# Patient Record
Sex: Female | Born: 1970 | Race: Black or African American | Hispanic: No | Marital: Married | Smoking: Never smoker
Health system: Southern US, Community
[De-identification: ages and names within clinical notes are randomized; demographics above are authoritative.]

## PROBLEM LIST (undated history)

## (undated) DIAGNOSIS — R51 Headache: Secondary | ICD-10-CM

## (undated) DIAGNOSIS — D649 Anemia, unspecified: Secondary | ICD-10-CM

## (undated) DIAGNOSIS — Z8669 Personal history of other diseases of the nervous system and sense organs: Secondary | ICD-10-CM

## (undated) DIAGNOSIS — I10 Essential (primary) hypertension: Secondary | ICD-10-CM

## (undated) HISTORY — PX: OTHER SURGICAL HISTORY: SHX169

## (undated) HISTORY — DX: Headache: R51

## (undated) HISTORY — DX: Personal history of other diseases of the nervous system and sense organs: Z86.69

## (undated) HISTORY — PX: APPENDECTOMY: SHX54

## (undated) HISTORY — DX: Anemia, unspecified: D64.9

## (undated) HISTORY — DX: Essential (primary) hypertension: I10

---

## 1998-06-16 ENCOUNTER — Observation Stay (HOSPITAL_COMMUNITY): Admission: AD | Admit: 1998-06-16 | Discharge: 1998-06-17 | Payer: Self-pay | Admitting: Obstetrics and Gynecology

## 1998-07-16 ENCOUNTER — Inpatient Hospital Stay (HOSPITAL_COMMUNITY): Admission: AD | Admit: 1998-07-16 | Discharge: 1998-07-16 | Payer: Self-pay | Admitting: Family Medicine

## 1998-08-31 ENCOUNTER — Inpatient Hospital Stay (HOSPITAL_COMMUNITY): Admission: AD | Admit: 1998-08-31 | Discharge: 1998-09-02 | Payer: Self-pay | Admitting: Obstetrics and Gynecology

## 1998-09-27 ENCOUNTER — Encounter (HOSPITAL_COMMUNITY): Admission: RE | Admit: 1998-09-27 | Discharge: 1998-12-26 | Payer: Self-pay

## 1998-10-24 ENCOUNTER — Encounter: Payer: Self-pay | Admitting: Emergency Medicine

## 1998-10-24 ENCOUNTER — Inpatient Hospital Stay (HOSPITAL_COMMUNITY): Admission: EM | Admit: 1998-10-24 | Discharge: 1998-10-25 | Payer: Self-pay | Admitting: Emergency Medicine

## 2001-01-30 ENCOUNTER — Emergency Department (HOSPITAL_COMMUNITY): Admission: EM | Admit: 2001-01-30 | Discharge: 2001-01-30 | Payer: Self-pay | Admitting: Emergency Medicine

## 2001-10-24 ENCOUNTER — Other Ambulatory Visit: Admission: RE | Admit: 2001-10-24 | Discharge: 2001-10-24 | Payer: Self-pay | Admitting: Obstetrics and Gynecology

## 2002-10-26 ENCOUNTER — Other Ambulatory Visit: Admission: RE | Admit: 2002-10-26 | Discharge: 2002-10-26 | Payer: Self-pay | Admitting: Obstetrics and Gynecology

## 2003-03-27 ENCOUNTER — Encounter: Payer: Self-pay | Admitting: Obstetrics and Gynecology

## 2003-03-27 ENCOUNTER — Ambulatory Visit (HOSPITAL_COMMUNITY): Admission: RE | Admit: 2003-03-27 | Discharge: 2003-03-27 | Payer: Self-pay | Admitting: Obstetrics and Gynecology

## 2003-06-22 ENCOUNTER — Encounter: Payer: Self-pay | Admitting: Internal Medicine

## 2003-11-06 ENCOUNTER — Other Ambulatory Visit: Admission: RE | Admit: 2003-11-06 | Discharge: 2003-11-06 | Payer: Self-pay | Admitting: Obstetrics and Gynecology

## 2004-08-08 ENCOUNTER — Ambulatory Visit: Payer: Self-pay | Admitting: Family Medicine

## 2004-09-12 ENCOUNTER — Ambulatory Visit: Payer: Self-pay | Admitting: Internal Medicine

## 2004-11-27 ENCOUNTER — Ambulatory Visit: Payer: Self-pay | Admitting: Internal Medicine

## 2005-12-02 ENCOUNTER — Encounter: Payer: Self-pay | Admitting: Obstetrics and Gynecology

## 2006-08-03 HISTORY — PX: MYOMECTOMY VAGINAL APPROACH: SUR871

## 2007-02-10 ENCOUNTER — Ambulatory Visit: Payer: Self-pay | Admitting: Internal Medicine

## 2007-02-10 LAB — CONVERTED CEMR LAB
ALT: 12 units/L (ref 0–35)
AST: 19 units/L (ref 0–37)
Albumin: 3.7 g/dL (ref 3.5–5.2)
Alkaline Phosphatase: 60 units/L (ref 39–117)
BUN: 6 mg/dL (ref 6–23)
Basophils Absolute: 0 10*3/uL (ref 0.0–0.1)
Basophils Relative: 0.5 % (ref 0.0–1.0)
Bilirubin, Direct: 0.1 mg/dL (ref 0.0–0.3)
CO2: 23 meq/L (ref 19–32)
Calcium: 9.1 mg/dL (ref 8.4–10.5)
Chloride: 102 meq/L (ref 96–112)
Cholesterol: 156 mg/dL (ref 0–200)
Creatinine, Ser: 1.2 mg/dL (ref 0.4–1.2)
Direct LDL: 65.8 mg/dL
Eosinophils Absolute: 0.2 10*3/uL (ref 0.0–0.6)
Eosinophils Relative: 2.1 % (ref 0.0–5.0)
GFR calc Af Amer: 65 mL/min
GFR calc non Af Amer: 54 mL/min
Glucose, Bld: 86 mg/dL (ref 70–99)
HCT: 34.9 % — ABNORMAL LOW (ref 36.0–46.0)
HDL: 58.3 mg/dL (ref >39.0)
Hemoglobin: 11.7 g/dL — ABNORMAL LOW (ref 12.0–15.0)
Lymphocytes Relative: 22.1 % (ref 12.0–46.0)
MCHC: 33.4 g/dL (ref 30.0–36.0)
MCV: 69.9 fL — ABNORMAL LOW (ref 78.0–100.0)
Monocytes Absolute: 0.3 10*3/uL (ref 0.2–0.7)
Monocytes Relative: 3.8 % (ref 3.0–11.0)
Neutro Abs: 5.9 10*3/uL (ref 1.4–7.7)
Neutrophils Relative %: 71.5 % (ref 43.0–77.0)
Platelets: 324 10*3/uL (ref 150–400)
Potassium: 3.1 meq/L — ABNORMAL LOW (ref 3.5–5.1)
RBC: 5 M/uL (ref 3.87–5.11)
RDW: 12.5 % (ref 11.5–14.6)
Sodium: 137 meq/L (ref 135–145)
TSH: 2.22 microintl units/mL (ref 0.35–5.50)
Total Bilirubin: 0.5 mg/dL (ref 0.3–1.2)
Total CHOL/HDL Ratio: 2.7
Total Protein: 7.3 g/dL (ref 6.0–8.3)
Triglycerides: 249 mg/dL (ref 0–149)
VLDL: 50 mg/dL — ABNORMAL HIGH (ref 0–40)
WBC: 8.2 10*3/uL (ref 4.5–10.5)

## 2007-02-17 ENCOUNTER — Encounter: Payer: Self-pay | Admitting: Internal Medicine

## 2007-02-17 ENCOUNTER — Ambulatory Visit: Payer: Self-pay | Admitting: Internal Medicine

## 2007-02-17 DIAGNOSIS — I10 Essential (primary) hypertension: Secondary | ICD-10-CM | POA: Insufficient documentation

## 2007-03-30 DIAGNOSIS — R51 Headache: Secondary | ICD-10-CM

## 2007-03-30 DIAGNOSIS — R519 Headache, unspecified: Secondary | ICD-10-CM | POA: Insufficient documentation

## 2007-06-22 ENCOUNTER — Ambulatory Visit: Payer: Self-pay | Admitting: Internal Medicine

## 2007-07-26 ENCOUNTER — Ambulatory Visit: Payer: Self-pay | Admitting: Internal Medicine

## 2007-11-24 ENCOUNTER — Ambulatory Visit: Payer: Self-pay | Admitting: Internal Medicine

## 2007-12-20 ENCOUNTER — Ambulatory Visit: Payer: Self-pay | Admitting: Internal Medicine

## 2008-02-15 ENCOUNTER — Encounter: Admission: RE | Admit: 2008-02-15 | Discharge: 2008-02-15 | Payer: Self-pay | Admitting: Obstetrics and Gynecology

## 2009-02-26 ENCOUNTER — Ambulatory Visit: Payer: Self-pay | Admitting: Internal Medicine

## 2009-02-26 LAB — CONVERTED CEMR LAB
ALT: 14 units/L (ref 0–35)
AST: 18 units/L (ref 0–37)
Albumin: 3.9 g/dL (ref 3.5–5.2)
Alkaline Phosphatase: 57 units/L (ref 39–117)
BUN: 9 mg/dL (ref 6–23)
Basophils Absolute: 0 10*3/uL (ref 0.0–0.1)
Basophils Relative: 0.6 % (ref 0.0–3.0)
Bilirubin Urine: NEGATIVE
Bilirubin, Direct: 0.1 mg/dL (ref 0.0–0.3)
CO2: 25 meq/L (ref 19–32)
Calcium: 8.8 mg/dL (ref 8.4–10.5)
Chloride: 105 meq/L (ref 96–112)
Cholesterol: 151 mg/dL (ref 0–200)
Creatinine, Ser: 1.3 mg/dL — ABNORMAL HIGH (ref 0.4–1.2)
Eosinophils Absolute: 0.2 10*3/uL (ref 0.0–0.7)
Eosinophils Relative: 2.4 % (ref 0.0–5.0)
GFR calc non Af Amer: 58.38 mL/min (ref >60)
Glucose, Bld: 82 mg/dL (ref 70–99)
Glucose, Urine, Semiquant: NEGATIVE
HCT: 33.4 % — ABNORMAL LOW (ref 36.0–46.0)
HDL: 40.8 mg/dL (ref >39.00)
Hemoglobin: 10.9 g/dL — ABNORMAL LOW (ref 12.0–15.0)
Ketones, urine, test strip: NEGATIVE
LDL Cholesterol: 90 mg/dL (ref 0–99)
Lymphocytes Relative: 28.4 % (ref 12.0–46.0)
Lymphs Abs: 1.8 10*3/uL (ref 0.7–4.0)
MCHC: 32.6 g/dL (ref 30.0–36.0)
MCV: 71.3 fL — ABNORMAL LOW (ref 78.0–100.0)
Monocytes Absolute: 0.3 10*3/uL (ref 0.1–1.0)
Monocytes Relative: 4.2 % (ref 3.0–12.0)
Neutro Abs: 4.2 10*3/uL (ref 1.4–7.7)
Neutrophils Relative %: 64.4 % (ref 43.0–77.0)
Nitrite: NEGATIVE
Platelets: 255 10*3/uL (ref 150.0–400.0)
Potassium: 3.5 meq/L (ref 3.5–5.1)
RBC: 4.69 M/uL (ref 3.87–5.11)
RDW: 13 % (ref 11.5–14.6)
Sodium: 139 meq/L (ref 135–145)
Specific Gravity, Urine: 1.025
TSH: 1.69 microintl units/mL (ref 0.35–5.50)
Total Bilirubin: 0.9 mg/dL (ref 0.3–1.2)
Total CHOL/HDL Ratio: 4
Total Protein: 7.4 g/dL (ref 6.0–8.3)
Triglycerides: 100 mg/dL (ref 0.0–149.0)
Urobilinogen, UA: 1
VLDL: 20 mg/dL (ref 0.0–40.0)
WBC Urine, dipstick: NEGATIVE
WBC: 6.5 10*3/uL (ref 4.5–10.5)
pH: 5.5

## 2009-03-07 ENCOUNTER — Ambulatory Visit: Payer: Self-pay | Admitting: Internal Medicine

## 2009-03-18 ENCOUNTER — Telehealth (INDEPENDENT_AMBULATORY_CARE_PROVIDER_SITE_OTHER): Payer: Self-pay | Admitting: *Deleted

## 2009-06-06 ENCOUNTER — Ambulatory Visit: Payer: Self-pay | Admitting: Internal Medicine

## 2009-07-02 ENCOUNTER — Ambulatory Visit: Payer: Self-pay | Admitting: Internal Medicine

## 2009-08-26 ENCOUNTER — Ambulatory Visit: Payer: Self-pay | Admitting: Internal Medicine

## 2009-09-06 ENCOUNTER — Ambulatory Visit: Payer: Self-pay | Admitting: Internal Medicine

## 2009-09-06 DIAGNOSIS — J069 Acute upper respiratory infection, unspecified: Secondary | ICD-10-CM | POA: Insufficient documentation

## 2010-02-24 ENCOUNTER — Telehealth: Payer: Self-pay | Admitting: Internal Medicine

## 2010-09-04 NOTE — Progress Notes (Signed)
Summary: No Call No Show - 6 mos rov  Phone Note Outgoing Call   Call placed by: Duard Brady LPN,  February 24, 2010 10:39 AM Call placed to: Patient Summary of Call: No Call No show 6 mos rov - attempt to call - ans mach - LMTCB - need to r/s Initial call taken by: Duard Brady LPN,  February 24, 2010 10:40 AM

## 2010-09-04 NOTE — Assessment & Plan Note (Signed)
Summary: FEVER, CONGESTION // RS   Vital Signs:  Patient profile:   40 year old female Weight:      178 pounds Temp:     101.8 degrees F oral BP sitting:   122 / 78  (left arm) Cuff size:   regular  Vitals Entered By: Raechel Ache, RN (September 06, 2009 10:43 AM) CC: c/o fever since last noc, chills , lower back pain, body aches, nausea   CC:  c/o fever since last noc, chills , lower back pain, body aches, and nausea.  History of Present Illness: 40 year old patient history of hypertension, who presents with a two-day history of fever, myalgias, mild head and chest congestion.  Fever as been as high as 102.  She has been using Mucinex.  Denies any shortness of breath or purulent productive cough.  She has treated hypertension, which has been stable.  Allergies: No Known Drug Allergies  Past History:  Past Medical History: Reviewed history from 03/07/2009 and no changes required. Hypertension Headache h/o migraines  Review of Systems       The patient complains of anorexia, fever, hoarseness, and prolonged cough.  The patient denies weight loss, weight gain, vision loss, decreased hearing, chest pain, syncope, dyspnea on exertion, peripheral edema, headaches, hemoptysis, abdominal pain, melena, hematochezia, severe indigestion/heartburn, hematuria, incontinence, genital sores, muscle weakness, suspicious skin lesions, transient blindness, difficulty walking, depression, unusual weight change, abnormal bleeding, enlarged lymph nodes, angioedema, and breast masses.    Physical Exam  General:  Well-developed,well-nourished,in no acute distress; alert,appropriate and cooperative throughout examination;  normal blood pressure Head:  Normocephalic and atraumatic without obvious abnormalities. No apparent alopecia or balding. Eyes:  No corneal or conjunctival inflammation noted. EOMI. Perrla. Funduscopic exam benign, without hemorrhages, exudates or papilledema. Vision grossly  normal. Ears:  External ear exam shows no significant lesions or deformities.  Otoscopic examination reveals clear canals, tympanic membranes are intact bilaterally without bulging, retraction, inflammation or discharge. Hearing is grossly normal bilaterally. Nose:  External nasal examination shows no deformity or inflammation. Nasal mucosa are pink and moist without lesions or exudates. Mouth:  pharyngeal erythema.  pharyngeal erythema.   Neck:  No deformities, masses, or tenderness noted. Lungs:  Normal respiratory effort, chest expands symmetrically. Lungs are clear to auscultation, no crackles or wheezes. Heart:  Normal rate and regular rhythm. S1 and S2 normal without gallop, murmur, click, rub or other extra sounds.   Impression & Recommendations:  Problem # 1:  URI (ICD-465.9)  Her updated medication list for this problem includes:    Hydrocodone-homatropine 5-1.5 Mg/82ml Syrp (Hydrocodone-homatropine) .Marland Kitchen... 1 teaspoon every 6 hours as needed for cough  Her updated medication list for this problem includes:    Hydrocodone-homatropine 5-1.5 Mg/37ml Syrp (Hydrocodone-homatropine) .Marland Kitchen... 1 teaspoon every 6 hours as needed for cough  Problem # 2:  HYPERTENSION (ICD-401.9)  Her updated medication list for this problem includes:    Tribenzor 40-10-25 Mg Tabs (Olmesartan-amlodipine-hctz) ..... One daily  Her updated medication list for this problem includes:    Tribenzor 40-10-25 Mg Tabs (Olmesartan-amlodipine-hctz) ..... One daily  Complete Medication List: 1)  Sumatriptan Succinate 100 Mg Tabs (Sumatriptan succinate) .... One daily as needed 2)  Alese (bcp)  .... Once daily 3)  Tribenzor 40-10-25 Mg Tabs (Olmesartan-amlodipine-hctz) .... One daily 4)  Hydrocodone-homatropine 5-1.5 Mg/9ml Syrp (Hydrocodone-homatropine) .Marland Kitchen.. 1 teaspoon every 6 hours as needed for cough  Patient Instructions: 1)  Take 650-1000mg  of Tylenol every 4-6 hours as needed for relief of pain or  comfort of fever  AVOID taking more than 4000mg   in a 24 hour period (can cause liver damage in higher doses). 2)  Get plenty of rest, drink lots of clear liquids.  Return in 7-10 days if you're not better:sooner if you're feeling worse. Prescriptions: HYDROCODONE-HOMATROPINE 5-1.5 MG/5ML SYRP (HYDROCODONE-HOMATROPINE) 1 teaspoon every 6 hours as needed for cough  #6 oz x 0   Entered and Authorized by:   Gordy Savers  MD   Signed by:   Gordy Savers  MD on 09/06/2009   Method used:   Print then Give to Patient   RxID:   [id number]

## 2010-09-04 NOTE — Assessment & Plan Note (Signed)
Summary: 2 month rov/njr   Vital Signs:  Patient profile:   40 year old female Weight:      180 pounds Temp:     98.8 degrees F oral BP sitting:   108 / 86  (left arm) Cuff size:   regular  Vitals Entered By: Raechel Ache, RN (August 26, 2009 8:41 AM) CC: ROV   CC:  ROV.  History of Present Illness: 40 year old patient who is seen today for follow-up of her hypertension.  She is doing quite well.  She takes her medication first thing in the morning prior to breakfast and she states that she often has stomach upset throughout the morning .  She has been compliant with her medication.  Home blood pressure readings have been showing a nice blood pressure control.  Her migraines have been stable.  Allergies: No Known Drug Allergies  Past History:  Past Medical History: Reviewed history from 03/07/2009 and no changes required. Hypertension Headache h/o migraines  Physical Exam  General:  Well-developed,well-nourished,in no acute distress; alert,appropriate and cooperative throughout examination; blood pressure 110/80 Neck:  No deformities, masses, or tenderness noted. Lungs:  Normal respiratory effort, chest expands symmetrically. Lungs are clear to auscultation, no crackles or wheezes. Heart:  Normal rate and regular rhythm. S1 and S2 normal without gallop, murmur, click, rub or other extra sounds. Extremities:  no edema   Impression & Recommendations:  Problem # 1:  HEADACHE (ICD-784.0)  Her updated medication list for this problem includes:    Sumatriptan Succinate 100 Mg Tabs (Sumatriptan succinate) ..... One daily as needed    Tramadol Hcl 50 Mg Tabs (Tramadol hcl) .Marland Kitchen..Marland Kitchen Two tablet initially then one every six hours as needed migraines.  Her updated medication list for this problem includes:    Sumatriptan Succinate 100 Mg Tabs (Sumatriptan succinate) ..... One daily as needed    Tramadol Hcl 50 Mg Tabs (Tramadol hcl) .Marland Kitchen..Marland Kitchen Two tablet initially then one every six  hours as needed migraines.  Problem # 2:  HYPERTENSION (ICD-401.9)  Her updated medication list for this problem includes:    Tribenzor 40-10-25 Mg Tabs (Olmesartan-amlodipine-hctz) ..... One daily    Her updated medication list for this problem includes:    Tribenzor 40-10-25 Mg Tabs (Olmesartan-amlodipine-hctz) ..... One daily  Orders: Prescription Created Electronically [id number])  Complete Medication List: 1)  Sumatriptan Succinate 100 Mg Tabs (Sumatriptan succinate) .... One daily as needed 2)  Alese (bcp)  .... Once daily 3)  Tramadol Hcl 50 Mg Tabs (Tramadol hcl) .... Two tablet initially then one every six hours as needed migraines. 4)  Tribenzor 40-10-25 Mg Tabs (Olmesartan-amlodipine-hctz) .... One daily  Patient Instructions: 1)  Please schedule a follow-up appointment in 6 months. 2)  Limit your Sodium (Salt). 3)  It is important that you exercise regularly at least 20 minutes 5 times a week. If you develop chest pain, have severe difficulty breathing, or feel very tired , stop exercising immediately and seek medical attention. 4)  Check your Blood Pressure regularly. If it is above: 150/90  you should make an appointment. Prescriptions: TRIBENZOR 40-10-25 MG TABS (OLMESARTAN-AMLODIPINE-HCTZ) one daily  #90 x 6   Entered and Authorized by:   Gordy Savers  MD   Signed by:   Gordy Savers  MD on 08/26/2009   Method used:   Print then Give to Patient   RxID:   [id number] TRIBENZOR 40-10-25 MG TABS (OLMESARTAN-AMLODIPINE-HCTZ) one daily  #90 x 6   Entered and Authorized by:  Gordy Savers  MD   Signed by:   Gordy Savers  MD on 08/26/2009   Method used:   Electronically to        [facility] (retail)       [address]       [place], [place]  [address]       Ph: [phone]       Fax: [phone]   RxID:   [id number]

## 2011-09-03 ENCOUNTER — Other Ambulatory Visit (INDEPENDENT_AMBULATORY_CARE_PROVIDER_SITE_OTHER): Payer: BC Managed Care – PPO

## 2011-09-03 DIAGNOSIS — Z0000 Encounter for general adult medical examination without abnormal findings: Secondary | ICD-10-CM

## 2011-09-03 LAB — HEPATIC FUNCTION PANEL
ALT: 23 U/L (ref 0–35)
AST: 26 U/L (ref 0–37)
Albumin: 4.4 g/dL (ref 3.5–5.2)
Alkaline Phosphatase: 82 U/L (ref 39–117)
Bilirubin, Direct: 0 mg/dL (ref 0.0–0.3)
Total Bilirubin: 0.7 mg/dL (ref 0.3–1.2)
Total Protein: 8.1 g/dL (ref 6.0–8.3)

## 2011-09-03 LAB — BASIC METABOLIC PANEL
CO2: 27 mEq/L (ref 19–32)
Chloride: 103 mEq/L (ref 96–112)
Creatinine, Ser: 1.2 mg/dL (ref 0.4–1.2)

## 2011-09-03 LAB — CBC WITH DIFFERENTIAL/PLATELET
Basophils Relative: 0.5 % (ref 0.0–3.0)
Eosinophils Absolute: 0.2 10*3/uL (ref 0.0–0.7)
MCHC: 32.5 g/dL (ref 30.0–36.0)
MCV: 70.8 fl — ABNORMAL LOW (ref 78.0–100.0)
Monocytes Absolute: 0.3 10*3/uL (ref 0.1–1.0)
Neutro Abs: 4.6 10*3/uL (ref 1.4–7.7)
Neutrophils Relative %: 64.8 % (ref 43.0–77.0)
RBC: 5.16 Mil/uL — ABNORMAL HIGH (ref 3.87–5.11)

## 2011-09-03 LAB — POCT URINALYSIS DIPSTICK
Bilirubin, UA: NEGATIVE
Glucose, UA: NEGATIVE
Ketones, UA: NEGATIVE
Leukocytes, UA: NEGATIVE
Nitrite, UA: NEGATIVE
Protein, UA: NEGATIVE
Spec Grav, UA: 1.01
Urobilinogen, UA: 0.2
pH, UA: 7

## 2011-09-03 LAB — LIPID PANEL
Cholesterol: 144 mg/dL (ref 0–200)
HDL: 48 mg/dL (ref >39.00)
LDL Cholesterol: 77 mg/dL (ref 0–99)
Total CHOL/HDL Ratio: 3
Triglycerides: 94 mg/dL (ref 0.0–149.0)
VLDL: 18.8 mg/dL (ref 0.0–40.0)

## 2011-09-03 LAB — TSH: TSH: 1.61 u[IU]/mL (ref 0.35–5.50)

## 2011-09-10 ENCOUNTER — Ambulatory Visit (INDEPENDENT_AMBULATORY_CARE_PROVIDER_SITE_OTHER): Payer: BC Managed Care – PPO | Admitting: Internal Medicine

## 2011-09-10 ENCOUNTER — Encounter: Payer: Self-pay | Admitting: Internal Medicine

## 2011-09-10 DIAGNOSIS — I10 Essential (primary) hypertension: Secondary | ICD-10-CM

## 2011-09-10 NOTE — Progress Notes (Signed)
Subjective:    Patient ID: Jean Bond, female    DOB: 01-04-1971, 41 y.o.   MRN: [id number]  HPI A 41 year old patient who is in today for a annual health assessment. She is followed mainly by gynecology. She is doing quite well without concerns or complaints  Past Medical History  Diagnosis Date  . Hypertension   . Headache   . History of migraine     History   Social History  . Marital Status: Married    Spouse Name: N/A    Number of Children: N/A  . Years of Education: N/A   Occupational History  . Not on file.   Social History Main Topics  . Smoking status: Never Smoker   . Smokeless tobacco: Never Used  . Alcohol Use: No  . Drug Use: Not on file  . Sexually Active: Not on file   Other Topics Concern  . Not on file   Social History Narrative  . No narrative on file    Past Surgical History  Procedure Date  . Appendectomy     Family History  Problem Relation Age of Onset  . Thyroid disease Mother   . Hypertension Father   . Diabetes Father   . Healthy Sister   . Healthy Brother   . Cancer Maternal Grandmother     colon   . Cancer Maternal Grandfather     colon    No Known Allergies  No current outpatient prescriptions on file prior to visit.    BP 110/72  Pulse 105  Temp(Src) 98.2 F (36.8 C) (Oral)  Ht 5\' 6"  (1.676 m)  Wt 186 lb (84.369 kg)  BMI 30.02 kg/m2  SpO2 98%  LMP 08/13/2011      Review of Systems  Constitutional: Negative for fever, appetite change, fatigue and unexpected weight change.  HENT: Negative for hearing loss, ear pain, nosebleeds, congestion, sore throat, mouth sores, trouble swallowing, neck stiffness, dental problem, voice change, sinus pressure and tinnitus.   Eyes: Negative for photophobia, pain, redness and visual disturbance.  Respiratory: Negative for cough, chest tightness and shortness of breath.   Cardiovascular: Negative for chest pain, palpitations and leg swelling.  Gastrointestinal:  Negative for nausea, vomiting, abdominal pain, diarrhea, constipation, blood in stool, abdominal distention and rectal pain.  Genitourinary: Negative for dysuria, urgency, frequency, hematuria, flank pain, vaginal bleeding, vaginal discharge, difficulty urinating, genital sores, vaginal pain, menstrual problem and pelvic pain.  Musculoskeletal: Negative for back pain and arthralgias.  Skin: Negative for rash.  Neurological: Negative for dizziness, syncope, speech difficulty, weakness, light-headedness, numbness and headaches.  Hematological: Negative for adenopathy. Does not bruise/bleed easily.  Psychiatric/Behavioral: Negative for suicidal ideas, behavioral problems, self-injury, dysphoric mood and agitation. The patient is not nervous/anxious.        Objective:   Physical Exam  Constitutional: She is oriented to person, place, and time. She appears well-developed and well-nourished.  HENT:  Head: Normocephalic and atraumatic.  Right Ear: External ear normal.  Left Ear: External ear normal.  Mouth/Throat: Oropharynx is clear and moist.  Eyes: Conjunctivae and EOM are normal.  Neck: Normal range of motion. Neck supple. No JVD present. No thyromegaly present.  Cardiovascular: Normal rate, regular rhythm, normal heart sounds and intact distal pulses.   No murmur heard. Pulmonary/Chest: Effort normal and breath sounds normal. She has no wheezes. She has no rales.  Abdominal: Soft. Bowel sounds are normal. She exhibits no distension and no mass. There is no tenderness. There is no rebound  and no guarding.  Genitourinary: Vagina normal.  Musculoskeletal: Normal range of motion. She exhibits no edema and no tenderness.  Neurological: She is alert and oriented to person, place, and time. She has normal reflexes. No cranial nerve deficit. She exhibits normal muscle tone. Coordination normal.  Skin: Skin is warm and dry. No rash noted.  Psychiatric: She has a normal mood and affect. Her behavior  is normal.          Assessment & Plan:    Preventive health examination. We'll recheck in one or 2 years. She will followup with gynecology this year.

## 2011-12-01 ENCOUNTER — Telehealth: Payer: Self-pay | Admitting: Internal Medicine

## 2011-12-01 NOTE — Telephone Encounter (Signed)
Spoke with pt - when in in Feb. - Bp ok - on no meds at that time. BP has been running 150/90 for a couple of days , no other sx. To see or call out med? Was on tribenzor in the past - didn't care for it - made her feel sleepy. If ok to call out something - [facility] [place].  Please advise

## 2011-12-01 NOTE — Telephone Encounter (Signed)
Spoke with pt - informed of dr. kwiatkowski's instructions . KIK 

## 2011-12-01 NOTE — Telephone Encounter (Signed)
Patient called stating that her bp is elevated and would like to know if the md can call her something in or if she should come in for an appt. Please advise.

## 2011-12-01 NOTE — Telephone Encounter (Signed)
Asked patient to continue observing the blood pressure over the next week or 2. Call next week for blood pressure remains 150/90 or greater. Encourage salt restrictions exercise and modest weight loss

## 2011-12-14 ENCOUNTER — Encounter: Payer: Self-pay | Admitting: Internal Medicine

## 2011-12-14 ENCOUNTER — Ambulatory Visit (INDEPENDENT_AMBULATORY_CARE_PROVIDER_SITE_OTHER): Payer: BC Managed Care – PPO | Admitting: Internal Medicine

## 2011-12-14 VITALS — BP 140/96 | Temp 98.5°F | Wt 185.0 lb

## 2011-12-14 DIAGNOSIS — J069 Acute upper respiratory infection, unspecified: Secondary | ICD-10-CM

## 2011-12-14 DIAGNOSIS — I10 Essential (primary) hypertension: Secondary | ICD-10-CM

## 2011-12-14 MED ORDER — LISINOPRIL-HYDROCHLOROTHIAZIDE 20-12.5 MG PO TABS: 1.0000 | ORAL_TABLET | Freq: Every day | ORAL | Status: DC

## 2011-12-14 NOTE — Progress Notes (Signed)
  Subjective:    Patient ID: Jean Bond, female    DOB: 05/31/1971, 41 y.o.   MRN: [id number]  HPI  41 year old patient who presents today with a three-day history of nasal congestion and mild dizziness she has a general sense of unwellness. No fever. She has a history of hypertension which in the past has required triple drug therapy. When seen for her physical earlier in the year she was off all medications and blood pressure was actually in a low normal range. More recently she has been tracking home blood pressure readings that have been mild to moderately elevated. Blood pressure today 160/94    Review of Systems  Constitutional: Negative.   HENT: Positive for congestion. Negative for hearing loss, sore throat, rhinorrhea, dental problem, sinus pressure and tinnitus.   Eyes: Negative for pain, discharge and visual disturbance.  Respiratory: Negative for cough and shortness of breath.   Cardiovascular: Negative for chest pain, palpitations and leg swelling.  Gastrointestinal: Negative for nausea, vomiting, abdominal pain, diarrhea, constipation, blood in stool and abdominal distention.  Genitourinary: Negative for dysuria, urgency, frequency, hematuria, flank pain, vaginal bleeding, vaginal discharge, difficulty urinating, vaginal pain and pelvic pain.  Musculoskeletal: Negative for joint swelling, arthralgias and gait problem.  Skin: Negative for rash.  Neurological: Negative for dizziness, syncope, speech difficulty, weakness, numbness and headaches.  Hematological: Negative for adenopathy.  Psychiatric/Behavioral: Negative for behavioral problems, dysphoric mood and agitation. The patient is not nervous/anxious.        Objective:   Physical Exam  Constitutional: She is oriented to person, place, and time. She appears well-developed and well-nourished.  HENT:  Head: Normocephalic.  Right Ear: External ear normal.  Left Ear: External ear normal.  Mouth/Throat:  Oropharynx is clear and moist.  Eyes: Conjunctivae and EOM are normal. Pupils are equal, round, and reactive to light.  Neck: Normal range of motion. Neck supple. No thyromegaly present.  Cardiovascular: Normal rate, regular rhythm, normal heart sounds and intact distal pulses.   Pulmonary/Chest: Effort normal and breath sounds normal.  Abdominal: Soft. Bowel sounds are normal. She exhibits no mass. There is no tenderness.  Musculoskeletal: Normal range of motion.  Lymphadenopathy:    She has no cervical adenopathy.  Neurological: She is alert and oriented to person, place, and time.  Skin: Skin is warm and dry. No rash noted.  Psychiatric: She has a normal mood and affect. Her behavior is normal.          Assessment & Plan:  Viral URI with mild labyrinth dysfunction. We'll treat with saline irrigation. Hypertension. States 2. We'll resume treatment with lisinopril hydrochlorothiazide. We'll continue home blood pressure monitoring recheck 3 months

## 2011-12-14 NOTE — Patient Instructions (Signed)
Use saline irrigation, warm  moist compresses and over-the-counter decongestants only as directed.  Call if there is no improvement in 5 to 7 days, or sooner if you develop increasing pain, fever, or any new symptoms.  Limit your sodium (Salt) intake  Please check your blood pressure on a regular basis.  If it is consistently greater than 150/90, please make an office appointment.

## 2012-01-27 ENCOUNTER — Other Ambulatory Visit: Payer: Self-pay | Admitting: Obstetrics and Gynecology

## 2012-03-15 ENCOUNTER — Ambulatory Visit: Payer: BC Managed Care – PPO | Admitting: Internal Medicine

## 2013-04-25 ENCOUNTER — Other Ambulatory Visit: Payer: Self-pay | Admitting: Obstetrics and Gynecology

## 2013-05-06 ENCOUNTER — Ambulatory Visit (INDEPENDENT_AMBULATORY_CARE_PROVIDER_SITE_OTHER): Payer: BC Managed Care – PPO | Admitting: Family Medicine

## 2013-05-06 VITALS — BP 144/88 | HR 82 | Temp 98.1°F | Resp 16 | Ht 65.75 in | Wt 189.2 lb

## 2013-05-06 DIAGNOSIS — K529 Noninfective gastroenteritis and colitis, unspecified: Secondary | ICD-10-CM

## 2013-05-06 DIAGNOSIS — R109 Unspecified abdominal pain: Secondary | ICD-10-CM

## 2013-05-06 DIAGNOSIS — R197 Diarrhea, unspecified: Secondary | ICD-10-CM

## 2013-05-06 DIAGNOSIS — K5289 Other specified noninfective gastroenteritis and colitis: Secondary | ICD-10-CM

## 2013-05-06 LAB — POCT UA - MICROSCOPIC ONLY
Crystals, Ur, HPF, POC: NEGATIVE
Mucus, UA: NEGATIVE
Yeast, UA: NEGATIVE

## 2013-05-06 LAB — POCT URINALYSIS DIPSTICK
Bilirubin, UA: NEGATIVE
Glucose, UA: NEGATIVE
Ketones, UA: NEGATIVE
Nitrite, UA: NEGATIVE
Urobilinogen, UA: 0.2
pH, UA: 6

## 2013-05-06 LAB — POCT CBC
Granulocyte percent: 82.2 %G — AB (ref 37–80)
HCT, POC: 36.9 % — AB (ref 37.7–47.9)
Lymph, poc: 1.2 (ref 0.6–3.4)
MCH, POC: 22.2 pg — AB (ref 27–31.2)
MCV: 71.7 fL — AB (ref 80–97)
MID (cbc): 0.2 (ref 0–0.9)
POC LYMPH PERCENT: 14.9 %L (ref 10–50)
RDW, POC: 14.8 %
WBC: 8 10*3/uL (ref 4.6–10.2)

## 2013-05-06 LAB — POCT URINE PREGNANCY: Preg Test, Ur: NEGATIVE

## 2013-05-06 MED ORDER — PROMETHAZINE HCL 25 MG PO TABS: 25.0000 mg | ORAL_TABLET | Freq: Three times a day (TID) | ORAL | Status: DC | PRN

## 2013-05-06 NOTE — Patient Instructions (Signed)
Viral Gastroenteritis Viral gastroenteritis is also known as stomach flu. This condition affects the stomach and intestinal tract. It can cause sudden diarrhea and vomiting. The illness typically lasts 3 to 8 days. Most people develop an immune response that eventually gets rid of the virus. While this natural response develops, the virus can make you quite ill. CAUSES  Many different viruses can cause gastroenteritis, such as rotavirus or noroviruses. You can catch one of these viruses by consuming contaminated food or water. You may also catch a virus by sharing utensils or other personal items with an infected person or by touching a contaminated surface. SYMPTOMS  The most common symptoms are diarrhea and vomiting. These problems can cause a severe loss of body fluids (dehydration) and a body salt (electrolyte) imbalance. Other symptoms may include:  Fever.  Headache.  Fatigue.  Abdominal pain. DIAGNOSIS  Your caregiver can usually diagnose viral gastroenteritis based on your symptoms and a physical exam. A stool sample may also be taken to test for the presence of viruses or other infections. TREATMENT  This illness typically goes away on its own. Treatments are aimed at rehydration. The most serious cases of viral gastroenteritis involve vomiting so severely that you are not able to keep fluids down. In these cases, fluids must be given through an intravenous line (IV). HOME CARE INSTRUCTIONS   Drink enough fluids to keep your urine clear or pale yellow. Drink small amounts of fluids frequently and increase the amounts as tolerated.  Ask your caregiver for specific rehydration instructions.  Avoid:  Foods high in sugar.  Alcohol.  Carbonated drinks.  Tobacco.  Juice.  Caffeine drinks.  Extremely hot or cold fluids.  Fatty, greasy foods.  Too much intake of anything at one time.  Dairy products until 24 to 48 hours after diarrhea stops.  You may consume probiotics.  Probiotics are active cultures of beneficial bacteria. They may lessen the amount and number of diarrheal stools in adults. Probiotics can be found in yogurt with active cultures and in supplements.  Wash your hands well to avoid spreading the virus.  Only take over-the-counter or prescription medicines for pain, discomfort, or fever as directed by your caregiver. Do not give aspirin to children. Antidiarrheal medicines are not recommended.  Ask your caregiver if you should continue to take your regular prescribed and over-the-counter medicines.  Keep all follow-up appointments as directed by your caregiver. SEEK IMMEDIATE MEDICAL CARE IF:   You are unable to keep fluids down.  You do not urinate at least once every 6 to 8 hours.  You develop shortness of breath.  You notice blood in your stool or vomit. This may look like coffee grounds.  You have abdominal pain that increases or is concentrated in one small area (localized).  You have persistent vomiting or diarrhea.  You have a fever.  The patient is a child younger than 3 months, and he or she has a fever.  The patient is a child older than 3 months, and he or she has a fever and persistent symptoms.  The patient is a child older than 3 months, and he or she has a fever and symptoms suddenly get worse.  The patient is a baby, and he or she has no tears when crying. MAKE SURE YOU:   Understand these instructions.  Will watch your condition.  Will get help right away if you are not doing well or get worse. Document Released: 07/20/2005 Document Revised: 10/12/2011 Document Reviewed: 05/06/2011   [employer] Patient Information 2014 [employer]  

## 2013-05-06 NOTE — Progress Notes (Signed)
Subjective:    Patient ID: Jean Bond, female    DOB: 06-27-1971, 42 y.o.   MRN: [id number] Chief Complaint  Patient presents with  . Abdominal Pain    N/D;HA;dizziness x today    HPI  Today around 11-12 started with nausea, stomach cramping, diarrhea, headache.  Felt like vomiting but never did - has had dry heaves.   Has not drank anything in sev hr but before that was getting down some water or gingerale.  Had a little soup around 2 p.m.  Last episode of diarrhea was around then as well.  Has taken her BP pill today but no other medications.  Has had some sick contacts at work but unsure with what.  Urinating normally. No dysuria.  Cramping in abd is very low.  No vag d/c.   Condoms for birth control. Past Medical History  Diagnosis Date  . Hypertension   . Headache(784.0)   . History of migraine    Current Outpatient Prescriptions on File Prior to Visit  Medication Sig Dispense Refill  . lisinopril-hydrochlorothiazide (ZESTORETIC) 20-12.5 MG per tablet Take 1 tablet by mouth daily.  90 tablet  3   No current facility-administered medications on file prior to visit.   No Known Allergies Past Surgical History  Procedure Laterality Date  . Appendectomy        Review of Systems  Constitutional: Positive for activity change, appetite change and fatigue. Negative for fever, chills and unexpected weight change.  Respiratory: Negative for shortness of breath.   Cardiovascular: Negative for chest pain and leg swelling.  Gastrointestinal: Positive for nausea, abdominal pain and diarrhea. Negative for vomiting, constipation, blood in stool and abdominal distention.  Genitourinary: Positive for pelvic pain. Negative for dysuria, decreased urine volume, vaginal bleeding, vaginal discharge and difficulty urinating.  Musculoskeletal: Negative for gait problem.  Skin: Negative for rash.  Hematological: Negative for adenopathy.  Psychiatric/Behavioral: Negative for sleep  disturbance.      BP 144/88  Pulse 82  Temp(Src) 98.1 F (36.7 C) (Oral)  Resp 16  Ht 5' 5.75" (1.67 m)  Wt 189 lb 3.2 oz (85.821 kg)  BMI 30.77 kg/m2  SpO2 100%  LMP 04/18/2013 Objective:   Physical Exam  Constitutional: She is oriented to person, place, and time. She appears well-developed and well-nourished. No distress.  HENT:  Head: Normocephalic and atraumatic.  Neck: Normal range of motion. Neck supple. No thyromegaly present.  Cardiovascular: Normal rate, regular rhythm, normal heart sounds and intact distal pulses.   Pulmonary/Chest: Effort normal and breath sounds normal. No respiratory distress.  Abdominal: Soft. Normal appearance. She exhibits no distension and no mass. Bowel sounds are increased. There is tenderness in the right lower quadrant, suprapubic area and left lower quadrant. There is no rebound, no guarding, no CVA tenderness and negative Murphy's sign.  Musculoskeletal: She exhibits no edema.  Lymphadenopathy:    She has no cervical adenopathy.  Neurological: She is alert and oriented to person, place, and time.  Skin: Skin is warm and dry. She is not diaphoretic. No erythema.  Psychiatric: She has a normal mood and affect. Her behavior is normal.      Results for orders placed in visit on 05/06/13  POCT CBC      Result Value Range   WBC 8.0  4.6 - 10.2 K/uL   Lymph, poc 1.2  0.6 - 3.4   POC LYMPH PERCENT 14.9  10 - 50 %L   MID (cbc) 0.2  0 - 0.9  POC MID % 2.9  0 - 12 %M   POC Granulocyte 6.6  2 - 6.9   Granulocyte percent 82.2 (*) 37 - 80 %G   RBC 5.14  4.04 - 5.48 M/uL   Hemoglobin 11.4 (*) 12.2 - 16.2 g/dL   HCT, POC 16.1 (*) 09.6 - 47.9 %   MCV 71.7 (*) 80 - 97 fL   MCH, POC 22.2 (*) 27 - 31.2 pg   MCHC 30.9 (*) 31.8 - 35.4 g/dL   RDW, POC 04.5     Platelet Count, POC 320  142 - 424 K/uL   MPV 10.0  0 - 99.8 fL  POCT URINALYSIS DIPSTICK      Result Value Range   Color, UA pale     Clarity, UA clear     Glucose, UA neg     Bilirubin,  UA neg     Ketones, UA neg     Spec Grav, UA <=1.005     Blood, UA trace     pH, UA 6.0     Protein, UA neg     Urobilinogen, UA 0.2     Nitrite, UA neg     Leukocytes, UA Negative    POCT URINE PREGNANCY      Result Value Range   Preg Test, Ur Negative    POCT UA - MICROSCOPIC ONLY      Result Value Range   WBC, Ur, HPF, POC 0-1     RBC, urine, microscopic 0-1     Bacteria, U Microscopic trace     Mucus, UA neg     Epithelial cells, urine per micros neg     Crystals, Ur, HPF, POC neg     Casts, Ur, LPF, POC neg     Yeast, UA neg      Assessment & Plan:  Diarrhea - Plan: POCT CBC, Comprehensive metabolic panel, POCT urinalysis dipstick, POCT urine pregnancy, POCT UA - Microscopic Only  Abdominal pain - Plan: POCT CBC, Comprehensive metabolic panel, POCT urinalysis dipstick, POCT urine pregnancy, POCT UA - Microscopic Only  Gastroenteritis - zofran 8mg  SL given in office x 1 - suspect viral etiology - rec hydration, rest, hygeine. If sxs worsen or continue more than 2d - RTC for further eval.  Meds ordered this encounter  Medications  . promethazine (PHENERGAN) 25 MG tablet    Sig: Take 1 tablet (25 mg total) by mouth every 8 (eight) hours as needed for nausea.    Dispense:  20 tablet    Refill:  0

## 2013-05-07 ENCOUNTER — Encounter: Payer: Self-pay | Admitting: Radiology

## 2013-05-07 LAB — COMPREHENSIVE METABOLIC PANEL
AST: 16 U/L (ref 0–37)
Albumin: 4.2 g/dL (ref 3.5–5.2)
Alkaline Phosphatase: 73 U/L (ref 39–117)
BUN: 11 mg/dL (ref 6–23)
Calcium: 9.5 mg/dL (ref 8.4–10.5)
Chloride: 102 mEq/L (ref 96–112)
Creat: 1.16 mg/dL — ABNORMAL HIGH (ref 0.50–1.10)
Glucose, Bld: 95 mg/dL (ref 70–99)
Potassium: 4 mEq/L (ref 3.5–5.3)
Total Bilirubin: 0.5 mg/dL (ref 0.3–1.2)

## 2013-05-08 ENCOUNTER — Encounter: Payer: Self-pay | Admitting: Family Medicine

## 2013-08-30 ENCOUNTER — Emergency Department (HOSPITAL_COMMUNITY): Payer: No Typology Code available for payment source

## 2013-08-30 ENCOUNTER — Encounter (HOSPITAL_COMMUNITY): Payer: Self-pay | Admitting: Emergency Medicine

## 2013-08-30 ENCOUNTER — Emergency Department (HOSPITAL_COMMUNITY)
Admission: EM | Admit: 2013-08-30 | Discharge: 2013-08-30 | Disposition: A | Discharge status: Home / Self Care | Payer: No Typology Code available for payment source | Attending: Emergency Medicine | Admitting: Emergency Medicine

## 2013-08-30 DIAGNOSIS — S8001XA Contusion of right knee, initial encounter: Secondary | ICD-10-CM

## 2013-08-30 DIAGNOSIS — R0789 Other chest pain: Secondary | ICD-10-CM | POA: Insufficient documentation

## 2013-08-30 DIAGNOSIS — Y9389 Activity, other specified: Secondary | ICD-10-CM | POA: Insufficient documentation

## 2013-08-30 DIAGNOSIS — S63501A Unspecified sprain of right wrist, initial encounter: Secondary | ICD-10-CM

## 2013-08-30 DIAGNOSIS — S8002XA Contusion of left knee, initial encounter: Secondary | ICD-10-CM

## 2013-08-30 DIAGNOSIS — IMO0001 Reserved for inherently not codable concepts without codable children: Secondary | ICD-10-CM | POA: Insufficient documentation

## 2013-08-30 DIAGNOSIS — I10 Essential (primary) hypertension: Secondary | ICD-10-CM | POA: Insufficient documentation

## 2013-08-30 DIAGNOSIS — S161XXA Strain of muscle, fascia and tendon at neck level, initial encounter: Secondary | ICD-10-CM

## 2013-08-30 DIAGNOSIS — Y92410 Unspecified street and highway as the place of occurrence of the external cause: Secondary | ICD-10-CM | POA: Insufficient documentation

## 2013-08-30 DIAGNOSIS — S63509A Unspecified sprain of unspecified wrist, initial encounter: Secondary | ICD-10-CM | POA: Insufficient documentation

## 2013-08-30 DIAGNOSIS — S139XXA Sprain of joints and ligaments of unspecified parts of neck, initial encounter: Secondary | ICD-10-CM | POA: Insufficient documentation

## 2013-08-30 DIAGNOSIS — Z79899 Other long term (current) drug therapy: Secondary | ICD-10-CM | POA: Insufficient documentation

## 2013-08-30 DIAGNOSIS — S8000XA Contusion of unspecified knee, initial encounter: Secondary | ICD-10-CM | POA: Insufficient documentation

## 2013-08-30 MED ORDER — HYDROCODONE-ACETAMINOPHEN 5-325 MG PO TABS: 1.0000 | ORAL_TABLET | Freq: Four times a day (QID) | ORAL | Status: DC | PRN

## 2013-08-30 MED ORDER — HYDROCODONE-ACETAMINOPHEN 5-325 MG PO TABS
1.0000 | ORAL_TABLET | Freq: Once | ORAL | Status: AC
  Administered 2013-08-30: 1 via ORAL
  Filled 2013-08-30: qty 1

## 2013-08-30 NOTE — ED Notes (Signed)
Patient transported to X-ray / CT scan. 
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
## 2013-08-30 NOTE — ED Notes (Signed)
No redness, edema, bruising noted to right knee, wrist. No abrasions or lac's

## 2013-08-30 NOTE — ED Notes (Signed)
Pt currently in radiology.
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
## 2013-08-31 ENCOUNTER — Encounter: Payer: Self-pay | Admitting: Radiology
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
## 2013-10-04 ENCOUNTER — Ambulatory Visit: Payer: BC Managed Care – PPO | Admitting: Physician Assistant

## 2013-10-04 VITALS — BP 140/100 | HR 102 | Temp 98.3°F | Resp 16 | Ht 64.5 in | Wt 189.2 lb

## 2013-10-04 DIAGNOSIS — J069 Acute upper respiratory infection, unspecified: Secondary | ICD-10-CM

## 2013-10-04 DIAGNOSIS — J029 Acute pharyngitis, unspecified: Secondary | ICD-10-CM

## 2013-10-04 DIAGNOSIS — B9789 Other viral agents as the cause of diseases classified elsewhere: Secondary | ICD-10-CM

## 2013-10-04 LAB — POCT RAPID STREP A (OFFICE): Rapid Strep A Screen: NEGATIVE

## 2013-10-04 MED ORDER — MUCINEX DM MAXIMUM STRENGTH 60-1200 MG PO TB12
1.0000 | ORAL_TABLET | Freq: Two times a day (BID) | ORAL | Status: DC
Start: 2013-10-04 — End: 2014-05-10

## 2013-10-04 MED ORDER — HYDROCOD POLST-CHLORPHEN POLST 10-8 MG/5ML PO LQCR: 5.0000 mL | Freq: Two times a day (BID) | ORAL | Status: AC

## 2013-10-04 NOTE — Patient Instructions (Signed)
Please push fluids.  Tylenol and Motrin for fever and body aches.    

## 2013-10-04 NOTE — Progress Notes (Signed)
   Subjective:    Patient ID: Jean Bond, female    DOB: [birth year], 43 y.o.   MRN: [id number]  HPI Pt presents to clinic with 1 week h/o cold symptoms with congestion but no rhinorrhea and cough that is mostly dry - she will rarely cough up stuff throughout the day.  She is having no SOB or wheezing,  She is not a smoker and has no h/o asthma.  She started to develop a ST yesterday and it is worse today.  She is up all night coughing.   OTC meds - mucinex fast max at night, dayquil in the am sick contacts - no strep exposure known    Review of Systems  Constitutional: Negative for fever and chills.  HENT: Positive for congestion, postnasal drip and sore throat. Negative for rhinorrhea.   Respiratory: Positive for cough. Negative for shortness of breath and wheezing.   Musculoskeletal: Negative for myalgias.       Objective:   Physical Exam  Vitals reviewed. Constitutional: She is oriented to person, place, and time. She appears well-developed and well-nourished.  HENT:  Head: Normocephalic and atraumatic.  Right Ear: Hearing, tympanic membrane, external ear and ear canal normal.  Left Ear: Hearing, tympanic membrane, external ear and ear canal normal.  Nose: Mucosal edema (red) present.  Mouth/Throat: Uvula is midline, oropharynx is clear and moist and mucous membranes are normal.  Eyes: Conjunctivae are normal.  Neck: Normal range of motion.  Cardiovascular: Normal rate, regular rhythm and normal heart sounds.   No murmur heard. Pulmonary/Chest: Effort normal and breath sounds normal. She has no wheezes.  Lymphadenopathy:    She has no cervical adenopathy.  Neurological: She is alert and oriented to person, place, and time.  Skin: Skin is warm and dry.  Psychiatric: She has a normal mood and affect. Her behavior is normal. Judgment and thought content normal.   Results for orders placed in visit on 10/04/13  POCT RAPID STREP A (OFFICE)      Result Value Ref  Range   Rapid Strep A Screen Negative  Negative        Assessment & Plan:  Sore throat - Plan: POCT rapid strep A  Viral URI with cough - Plan: chlorpheniramine-HYDROcodone (TUSSIONEX PENNKINETIC ER) 10-8 MG/5ML LQCR, Dextromethorphan-Guaifenesin (MUCINEX DM MAXIMUM STRENGTH) 60-1200 MG TB12  Symptomatic care d/w pt.  If she is not improved in 1 week she will call with her symptoms to determine if an abx is needed.  Windell Hummingbird PA-C 10/04/2013 10:40 AM

## 2014-05-03 ENCOUNTER — Other Ambulatory Visit: Payer: Self-pay | Admitting: Obstetrics and Gynecology

## 2014-05-03 LAB — HEPATIC FUNCTION PANEL
ALK PHOS: 81 U/L (ref 25–125)
ALT: 15 U/L (ref 7–35)
AST: 17 U/L (ref 13–35)
Bilirubin, Total: 0.4 mg/dL

## 2014-05-03 LAB — LIPID PANEL
CHOLESTEROL: 142 mg/dL (ref 0–200)
HDL: 41 mg/dL (ref 35–70)
LDL Cholesterol: 81 mg/dL
LDl/HDL Ratio: 3.4
Triglycerides: 99 mg/dL (ref 40–160)

## 2014-05-03 LAB — CBC AND DIFFERENTIAL
HCT: 35 % — AB (ref 36–46)
Hemoglobin: 11.1 g/dL — AB (ref 12.0–16.0)
Platelets: 32 10*3/uL — AB (ref 150–399)
WBC: 7.5 10*3/mL

## 2014-05-03 LAB — BASIC METABOLIC PANEL
BUN: 15 mg/dL (ref 4–21)
Creatinine: 1.3 mg/dL — AB (ref 0.5–1.1)
GLUCOSE: 93 mg/dL
POTASSIUM: 3.9 mmol/L (ref 3.4–5.3)
Sodium: 138 mmol/L (ref 137–147)

## 2014-05-03 LAB — TSH: TSH: 1.91 u[IU]/mL (ref 0.41–5.90)

## 2014-05-04 LAB — CYTOLOGY - PAP

## 2014-05-10 ENCOUNTER — Encounter: Payer: Self-pay | Admitting: Internal Medicine

## 2014-05-10 ENCOUNTER — Ambulatory Visit (INDEPENDENT_AMBULATORY_CARE_PROVIDER_SITE_OTHER): Payer: BC Managed Care – PPO | Admitting: Internal Medicine

## 2014-05-10 VITALS — BP 134/90 | HR 76 | Temp 98.3°F | Ht 64.5 in | Wt 188.0 lb

## 2014-05-10 DIAGNOSIS — N183 Chronic kidney disease, stage 3 unspecified: Secondary | ICD-10-CM

## 2014-05-10 NOTE — Progress Notes (Signed)
Subjective:    Patient ID: Jean Bond, female    DOB: 21-Mar-1971, 43 y.o.   MRN: [id number]  HPI   43 year old patient who has a history of treated hypertension.  She monitors home blood pressure readings with the last results.  She was seen by OB/GYN last week with a normal blood pressure.  Laboratory studies were obtained and revealed a slightly elevated creatinine.  She has run borderline high number years with a normal BUN She feels well without concerns or complaints.  She has been compliant with her medications.  Laboratory studies reviewed.  Creatinine 1. 2 8 with a estimated GFR of 37.9 8.  Father has recently been started on dialysis and has a history of both diabetes and hypertension   Past Medical History  Diagnosis Date  . Headache(784.0)   . History of migraine   . Hypertension     History   Social History  . Marital Status: Married    Spouse Name: N/A    Number of Children: N/A  . Years of Education: N/A   Occupational History  . Not on file.   Social History Main Topics  . Smoking status: Never Smoker   . Smokeless tobacco: Not on file  . Alcohol Use: No  . Drug Use: No  . Sexual Activity: Not on file   Other Topics Concern  . Not on file   Social History Narrative   ** Merged History Encounter **        Past Surgical History  Procedure Laterality Date  . Appendectomy    . Myomechomy      Family History  Problem Relation Age of Onset  . Thyroid disease Mother   . Hypertension Father   . Diabetes Father   . Healthy Sister   . Healthy Brother   . Cancer Maternal Grandmother     colon   . Cancer Maternal Grandfather     colon    No Known Allergies  Current Outpatient Prescriptions on File Prior to Visit  Medication Sig Dispense Refill  . lisinopril-hydrochlorothiazide (ZESTORETIC) 20-12.5 MG per tablet Take 1 tablet by mouth daily.  90 tablet  3  . HYDROcodone-acetaminophen (NORCO/VICODIN) 5-325 MG per tablet Take 1 tablet  by mouth every 6 (six) hours as needed.  12 tablet  0   No current facility-administered medications on file prior to visit.    BP 134/90  Pulse 76  Temp(Src) 98.3 F (36.8 C) (Oral)  Ht 5' 4.5" (1.638 m)  Wt 188 lb (85.276 kg)  BMI 31.78 kg/m2    Review of Systems  Constitutional: Negative.   HENT: Negative for congestion, dental problem, hearing loss, rhinorrhea, sinus pressure, sore throat and tinnitus.   Eyes: Negative for pain, discharge and visual disturbance.  Respiratory: Negative for cough and shortness of breath.   Cardiovascular: Negative for chest pain, palpitations and leg swelling.  Gastrointestinal: Negative for nausea, vomiting, abdominal pain, diarrhea, constipation, blood in stool and abdominal distention.  Genitourinary: Negative for dysuria, urgency, frequency, hematuria, flank pain, vaginal bleeding, vaginal discharge, difficulty urinating, vaginal pain and pelvic pain.  Musculoskeletal: Negative for arthralgias, gait problem and joint swelling.  Skin: Negative for rash.  Neurological: Negative for dizziness, syncope, speech difficulty, weakness, numbness and headaches.  Hematological: Negative for adenopathy.  Psychiatric/Behavioral: Negative for behavioral problems, dysphoric mood and agitation. The patient is not nervous/anxious.        Objective:   Physical Exam  Constitutional: She is oriented to person, place,  and time. She appears well-developed and well-nourished.  Blood pressure 128/90  HENT:  Head: Normocephalic.  Right Ear: External ear normal.  Left Ear: External ear normal.  Mouth/Throat: Oropharynx is clear and moist.  Eyes: Conjunctivae and EOM are normal. Pupils are equal, round, and reactive to light.  Neck: Normal range of motion. Neck supple. No thyromegaly present.  Cardiovascular: Normal rate, regular rhythm, normal heart sounds and intact distal pulses.   Pulmonary/Chest: Effort normal and breath sounds normal.  Abdominal: Soft.  Bowel sounds are normal. She exhibits no mass. There is no tenderness.  Musculoskeletal: Normal range of motion.  Lymphadenopathy:    She has no cervical adenopathy.  Neurological: She is alert and oriented to person, place, and time.  Skin: Skin is warm and dry. No rash noted.  Psychiatric: She has a normal mood and affect. Her behavior is normal.          Assessment & Plan:   Hypertension.  Adequate control on combination therapy with home blood pressure readings consistently less than 130 over 90 CKD.  We'll set up for a nephrology evaluation and followup.  At the present time.  Will continue on present medical regimen

## 2014-05-10 NOTE — Progress Notes (Signed)
Pre visit review using our clinic review tool, if applicable. No additional management support is needed unless otherwise documented below in the visit note. 

## 2014-05-10 NOTE — Patient Instructions (Signed)
Nephrology followup as discussed  Limit your sodium (Salt) intake  Please check your blood pressure on a regular basis.  If it is consistently greater than 150/90, please make an office appointment.    It is important that you exercise regularly, at least 20 minutes 3 to 4 times per week.  If you develop chest pain or shortness of breath seek  medical attention.  Return in 6 months for follow-up

## 2014-06-04 ENCOUNTER — Encounter: Payer: Self-pay | Admitting: Internal Medicine

## 2014-06-15 ENCOUNTER — Encounter: Payer: Self-pay | Admitting: Internal Medicine

## 2015-05-15 ENCOUNTER — Other Ambulatory Visit: Payer: Self-pay | Admitting: Obstetrics and Gynecology

## 2015-05-16 LAB — CYTOLOGY - PAP

## 2016-05-26 ENCOUNTER — Other Ambulatory Visit: Payer: Self-pay | Admitting: Obstetrics and Gynecology

## 2016-05-27 LAB — CYTOLOGY - PAP

## 2016-09-06 ENCOUNTER — Emergency Department (HOSPITAL_COMMUNITY): Payer: Managed Care, Other (non HMO)

## 2016-09-06 ENCOUNTER — Emergency Department (HOSPITAL_COMMUNITY)
Admission: EM | Admit: 2016-09-06 | Discharge: 2016-09-06 | Disposition: A | Discharge status: Home / Self Care | Payer: Managed Care, Other (non HMO) | Attending: Emergency Medicine | Admitting: Emergency Medicine

## 2016-09-06 ENCOUNTER — Encounter (HOSPITAL_COMMUNITY): Payer: Self-pay

## 2016-09-06 DIAGNOSIS — I10 Essential (primary) hypertension: Secondary | ICD-10-CM | POA: Insufficient documentation

## 2016-09-06 DIAGNOSIS — Z79899 Other long term (current) drug therapy: Secondary | ICD-10-CM | POA: Insufficient documentation

## 2016-09-06 DIAGNOSIS — R935 Abnormal findings on diagnostic imaging of other abdominal regions, including retroperitoneum: Secondary | ICD-10-CM | POA: Insufficient documentation

## 2016-09-06 DIAGNOSIS — R109 Unspecified abdominal pain: Secondary | ICD-10-CM | POA: Diagnosis present

## 2016-09-06 DIAGNOSIS — R52 Pain, unspecified: Secondary | ICD-10-CM

## 2016-09-06 DIAGNOSIS — N12 Tubulo-interstitial nephritis, not specified as acute or chronic: Secondary | ICD-10-CM

## 2016-09-06 DIAGNOSIS — R9389 Abnormal findings on diagnostic imaging of other specified body structures: Secondary | ICD-10-CM

## 2016-09-06 LAB — CBC WITH DIFFERENTIAL/PLATELET
BASOS ABS: 0 10*3/uL (ref 0.0–0.1)
Basophils Relative: 0 %
EOS ABS: 0.1 10*3/uL (ref 0.0–0.7)
EOS PCT: 1 %
HCT: 32.5 % — ABNORMAL LOW (ref 36.0–46.0)
Hemoglobin: 11 g/dL — ABNORMAL LOW (ref 12.0–15.0)
LYMPHS ABS: 1.6 10*3/uL (ref 0.7–4.0)
Lymphocytes Relative: 17 %
MCH: 22.7 pg — ABNORMAL LOW (ref 26.0–34.0)
MCHC: 33.8 g/dL (ref 30.0–36.0)
MCV: 67 fL — AB (ref 78.0–100.0)
MONO ABS: 0.3 10*3/uL (ref 0.1–1.0)
Monocytes Relative: 3 %
NEUTROS PCT: 79 %
Neutro Abs: 7.7 10*3/uL (ref 1.7–7.7)
PLATELETS: 338 10*3/uL (ref 150–400)
RBC: 4.85 MIL/uL (ref 3.87–5.11)
RDW: 13.9 % (ref 11.5–15.5)
WBC: 9.7 10*3/uL (ref 4.0–10.5)

## 2016-09-06 LAB — COMPREHENSIVE METABOLIC PANEL
ALBUMIN: 3.6 g/dL (ref 3.5–5.0)
ALT: 14 U/L (ref 14–54)
ANION GAP: 14 (ref 5–15)
AST: 24 U/L (ref 15–41)
Alkaline Phosphatase: 56 U/L (ref 38–126)
BILIRUBIN TOTAL: 0.8 mg/dL (ref 0.3–1.2)
BUN: 11 mg/dL (ref 6–20)
CHLORIDE: 99 mmol/L — AB (ref 101–111)
CO2: 21 mmol/L — ABNORMAL LOW (ref 22–32)
Calcium: 8.5 mg/dL — ABNORMAL LOW (ref 8.9–10.3)
Creatinine, Ser: 1.19 mg/dL — ABNORMAL HIGH (ref 0.44–1.00)
GFR calc Af Amer: >60 mL/min (ref >60)
GFR calc non Af Amer: 53 mL/min — ABNORMAL LOW (ref >60)
GLUCOSE: 125 mg/dL — AB (ref 65–99)
POTASSIUM: 3.2 mmol/L — AB (ref 3.5–5.1)
SODIUM: 134 mmol/L — AB (ref 135–145)
TOTAL PROTEIN: 7.1 g/dL (ref 6.5–8.1)

## 2016-09-06 LAB — URINALYSIS, ROUTINE W REFLEX MICROSCOPIC
BACTERIA UA: NONE SEEN
BILIRUBIN URINE: NEGATIVE
Glucose, UA: NEGATIVE mg/dL
Hgb urine dipstick: NEGATIVE
KETONES UR: NEGATIVE mg/dL
LEUKOCYTES UA: NEGATIVE
Nitrite: NEGATIVE
PH: 5 (ref 5.0–8.0)
PROTEIN: 100 mg/dL — AB
Specific Gravity, Urine: 1.017 (ref 1.005–1.030)

## 2016-09-06 LAB — POC URINE PREG, ED: PREG TEST UR: NEGATIVE

## 2016-09-06 LAB — LIPASE, BLOOD: Lipase: 30 U/L (ref 11–51)

## 2016-09-06 MED ORDER — SODIUM CHLORIDE 0.9 % IV BOLUS (SEPSIS)
1000.0000 mL | Freq: Once | INTRAVENOUS | Status: AC
  Administered 2016-09-06: 1000 mL via INTRAVENOUS

## 2016-09-06 MED ORDER — HYDROCODONE-ACETAMINOPHEN 5-325 MG PO TABS: ORAL_TABLET | ORAL | 0 refills | Status: DC

## 2016-09-06 MED ORDER — MORPHINE SULFATE (PF) 4 MG/ML IV SOLN
4.0000 mg | Freq: Once | INTRAVENOUS | Status: AC
  Administered 2016-09-06: 4 mg via INTRAVENOUS
  Filled 2016-09-06: qty 1

## 2016-09-06 MED ORDER — CEPHALEXIN 500 MG PO CAPS: 500.0000 mg | ORAL_CAPSULE | Freq: Four times a day (QID) | ORAL | 0 refills | Status: AC

## 2016-09-06 MED ORDER — DEXTROSE 5 % IV SOLN
1.0000 g | Freq: Once | INTRAVENOUS | Status: AC
  Administered 2016-09-06: 1 g via INTRAVENOUS
  Filled 2016-09-06: qty 10

## 2016-09-06 MED ORDER — HYDROMORPHONE HCL 2 MG/ML IJ SOLN
0.5000 mg | Freq: Once | INTRAMUSCULAR | Status: AC
  Administered 2016-09-06: 0.5 mg via INTRAVENOUS
  Filled 2016-09-06: qty 1

## 2016-09-06 MED ORDER — ONDANSETRON HCL 4 MG/2ML IJ SOLN
4.0000 mg | Freq: Once | INTRAMUSCULAR | Status: AC
  Administered 2016-09-06: 4 mg via INTRAVENOUS
  Filled 2016-09-06: qty 2

## 2016-09-06 NOTE — Discharge Instructions (Signed)
Take vicodin for breakthrough pain, do not drink alcohol, drive, care for children or do other critical tasks while taking vicodin.   Take your antibiotics as directed and to completion. You should never have any leftover antibiotics! Push fluids and stay well hydrated.   Any antibiotic use can reduce the efficacy of hormonal birth control. Please use back up method of contraception.   Please follow with your primary care doctor in the next 2 days for a check-up. They must obtain records for further management.   Do not hesitate to return to the Emergency Department for any new, worsening or concerning symptoms.

## 2016-09-06 NOTE — ED Provider Notes (Signed)
[facility] DEPT Provider Note   CSN: [id number] Arrival date & time: 09/06/16  0609     History   Chief Complaint Chief Complaint  Patient presents with  . Flank Pain   HPI   Blood pressure 156/99, pulse 69, temperature 98.2 F (36.8 C), temperature source Oral, resp. rate 18, last menstrual period 08/12/2016, SpO2 100 %.  Jean Bond is a 46 y.o. female complaining of Right-sided flank pain onset 1 week ago described as sharp and she is being treated for a UTI with Macrobid. She was seen at her primary care office and started on Macrobid although she was told that the urinalysis was not truly convincing for infection. She states that the pain got significantly worse over the last day. She denies fevers, chills, nausea, vomiting, change in bowel or bladder habits she notes that she initially had dysuria but after being on the Macrobid that has improved. She doesn't have a history of kidney stones. Her husband states that the pain is exacerbated postprandially.  Past Medical History:  Diagnosis Date  . Headache(784.0)   . History of migraine   . Hypertension     Patient Active Problem List   Diagnosis Date Noted  . HYPERTENSION 02/17/2007    Past Surgical History:  Procedure Laterality Date  . APPENDECTOMY    . [place]      OB History    Gravida Para Term Preterm AB Living   2 2           SAB TAB Ectopic Multiple Live Births                   Home Medications    Prior to Admission medications   Medication Sig Start Date End Date Taking? Authorizing Provider  desogestrel-ethinyl estradiol (AZURETTE) 0.15-0.02/0.01 MG (21/5) tablet Take 1 tablet by mouth daily.   Yes Historical Provider, MD  lisinopril-hydrochlorothiazide (ZESTORETIC) 20-12.5 MG per tablet Take 1 tablet by mouth daily. 12/14/11 09/06/16 Yes Marletta Lor, MD  Multiple Vitamin (MULTIVITAMIN) tablet Take 1 tablet by mouth daily.   Yes Historical Provider, MD  nitrofurantoin,  macrocrystal-monohydrate, (MACROBID) 100 MG capsule Take 100 mg by mouth 2 (two) times daily.   Yes Historical Provider, MD  cephALEXin (KEFLEX) 500 MG capsule Take 1 capsule (500 mg total) by mouth 4 (four) times daily. 09/06/16 09/20/16  Elmyra Ricks Saksham Akkerman, PA-C  HYDROcodone-acetaminophen (NORCO/VICODIN) 5-325 MG tablet Take 1-2 tablets by mouth every 6 hours as needed for pain and/or cough. 09/06/16   Elmyra Ricks Malon Branton, PA-C    Family History Family History  Problem Relation Age of Onset  . Hypertension Father   . Diabetes Father   . Healthy Sister   . Healthy Brother   . Cancer Maternal Grandmother     colon   . Thyroid disease Mother   . Cancer Maternal Grandfather     colon    Social History Social History  Substance Use Topics  . Smoking status: Never Smoker  . Smokeless tobacco: Not on file  . Alcohol use No     Allergies   Patient has no known allergies.   Review of Systems Review of Systems  10 systems reviewed and found to be negative, except as noted in the HPI.   Physical Exam Updated Vital Signs BP 153/85   Pulse 63   Temp 98.2 F (36.8 C) (Oral)   Resp 18   LMP 08/12/2016   SpO2 100%   Physical Exam  Constitutional: She is oriented  to person, place, and time. She appears well-developed and well-nourished. No distress.  HENT:  Head: Normocephalic and atraumatic.  Mouth/Throat: Oropharynx is clear and moist.  Eyes: Conjunctivae and EOM are normal. Pupils are equal, round, and reactive to light.  Neck: Normal range of motion.  Cardiovascular: Normal rate, regular rhythm and intact distal pulses.  Exam reveals no gallop and no friction rub.   No murmur heard. Pulmonary/Chest: Effort normal and breath sounds normal. No respiratory distress. She has no wheezes. She has no rales. She exhibits no tenderness.  Abdominal: Soft. She exhibits no distension and no mass. There is tenderness. There is no rebound and no guarding. No hernia.  No focal CVA tenderness  to percussion bilaterally  Murphy sign positive, no guarding or rebound.  Musculoskeletal: Normal range of motion.  Neurological: She is alert and oriented to person, place, and time.  Skin: Capillary refill takes less than 2 seconds. She is not diaphoretic.  Psychiatric: She has a normal mood and affect.  Nursing note and vitals reviewed.    ED Treatments / Results  Labs (all labs ordered are listed, but only abnormal results are displayed) Labs Reviewed  URINALYSIS, ROUTINE W REFLEX MICROSCOPIC - Abnormal; Notable for the following:       Result Value   Protein, ur 100 (*)    Squamous Epithelial / LPF 0-5 (*)    All other components within normal limits  CBC WITH DIFFERENTIAL/PLATELET - Abnormal; Notable for the following:    Hemoglobin 11.0 (*)    HCT 32.5 (*)    MCV 67.0 (*)    MCH 22.7 (*)    All other components within normal limits  COMPREHENSIVE METABOLIC PANEL - Abnormal; Notable for the following:    Sodium 134 (*)    Potassium 3.2 (*)    Chloride 99 (*)    CO2 21 (*)    Glucose, Bld 125 (*)    Creatinine, Ser 1.19 (*)    Calcium 8.5 (*)    GFR calc non Af Amer 53 (*)    All other components within normal limits  URINE CULTURE  LIPASE, BLOOD  POC URINE PREG, ED    EKG  EKG Interpretation None       Radiology Ct Renal Stone Study  Result Date: 09/06/2016 CLINICAL DATA:  Right flank pain. EXAM: CT ABDOMEN AND PELVIS WITHOUT CONTRAST TECHNIQUE: Multidetector CT imaging of the abdomen and pelvis was performed following the standard protocol without IV contrast. COMPARISON:  Abdomen ultrasound from earlier today. FINDINGS: Lower chest: No acute abnormality. Hepatobiliary: No focal liver abnormality is seen. No gallstones, gallbladder wall thickening, or biliary dilatation. Pancreas: Unremarkable. No pancreatic ductal dilatation or surrounding inflammatory changes. Spleen: Normal in size without focal abnormality. Adrenals/Urinary Tract: Moderate right-sided  hydronephrosis and hydroureter. Perinephric and periureteral fluid stranding/inflammation. No renal or ureteral calculi identified. Bladder is decompressed. No bladder stones seen. Left kidney appears normal without stone, hydronephrosis or perinephric inflammation. Stomach/Bowel: Bowel is normal in caliber. No bowel wall thickening or evidence of bowel wall inflammation seen. Surgical changes in the right lower quadrant, presumed appendectomy. Vascular/Lymphatic: No significant vascular findings are present. No enlarged abdominal or pelvic lymph nodes. Reproductive: Uterus is prominent in size, with scattered hyperdense foci indicating underlying fibroids. No adnexal mass. Other: No abscess collection or free intraperitoneal air seen. Musculoskeletal: No acute or suspicious osseous finding. Superficial soft tissues are unremarkable. IMPRESSION: 1. Moderate right-sided hydronephrosis with associated perinephric and periureteral inflammation. No renal, ureteral or bladder calculi.  Findings could represent either ascending infection with pyelonephritis or recently passed stone. Recommend correlation with clinical symptoms and/or urinalysis to help differentiate between these 2 possibilities. 2. Enlarged leiomyomatous uterus. Electronically Signed   By: Franki Cabot M.D.   On: 09/06/2016 12:38   US Abdomen Limited Ruq  Result Date: 09/06/2016 CLINICAL DATA:  Acute right upper quadrant abdominal pain. EXAM: US ABDOMEN LIMITED - RIGHT UPPER QUADRANT COMPARISON:  None. FINDINGS: Gallbladder: No gallstones or wall thickening visualized. No sonographic Murphy sign noted by sonographer. Common bile duct: Diameter: 4.3 mm which is within normal limits. Liver: No focal lesion identified. Within normal limits in parenchymal echogenicity. Incidental note is noted of moderate right hydronephrosis. IMPRESSION: Moderate right hydronephrosis is noted of unknown etiology. No abnormality seen involving the gallbladder, liver or  biliary ducts. Electronically Signed   By: Marijo Conception, M.D.   On: 09/06/2016 11:21    Procedures Procedures (including critical care time)  Medications Ordered in ED Medications  HYDROmorphone (DILAUDID) injection 0.5 mg (0.5 mg Intravenous Given 09/06/16 1016)  ondansetron (ZOFRAN) injection 4 mg (4 mg Intravenous Given 09/06/16 1016)  sodium chloride 0.9 % bolus 1,000 mL (0 mLs Intravenous Stopped 09/06/16 1524)  morphine 4 MG/ML injection 4 mg (4 mg Intravenous Given 09/06/16 1211)  cefTRIAXone (ROCEPHIN) 1 g in dextrose 5 % 50 mL IVPB (0 g Intravenous Stopped 09/06/16 1524)     Initial Impression / Assessment and Plan / ED Course  I have reviewed the triage vital signs and the nursing notes.  Pertinent labs & imaging results that were available during my care of the patient were reviewed by me and considered in my medical decision making (see chart for details).     Vitals:   09/06/16 1145 09/06/16 1215 09/06/16 1230 09/06/16 1245  BP: 156/95 162/98 156/87 153/85  Pulse: 65 73 68 63  Resp:      Temp:      TempSrc:      SpO2: 100% 100% 99% 100%    Medications  HYDROmorphone (DILAUDID) injection 0.5 mg (0.5 mg Intravenous Given 09/06/16 1016)  ondansetron (ZOFRAN) injection 4 mg (4 mg Intravenous Given 09/06/16 1016)  sodium chloride 0.9 % bolus 1,000 mL (0 mLs Intravenous Stopped 09/06/16 1524)  morphine 4 MG/ML injection 4 mg (4 mg Intravenous Given 09/06/16 1211)  cefTRIAXone (ROCEPHIN) 1 g in dextrose 5 % 50 mL IVPB (0 g Intravenous Stopped 09/06/16 1524)    Jean Bond is 47 y.o. female presenting with Flank pain onset 1 week ago. She is being treated for UTI with Macrobid. Pain is postprandial, Murphy sign positive, will obtain blood work and right upper quadrant ultrasound.  Blood work with no significant abnormality, no leukocytosis, transaminitis, lipase is normal. Urinalysis with no signs of infection however patient has been taking Macrobid over the last several  days.  Right upper quadrant ultrasound with no signs of gallstones or cholecystitis. They do see hydronephrosis. CT stone protocol pending.  CT with a moderate right-sided hydronephrosis with perinephric and periureteral inflammation and no stones. Suggestive of pyelonephritis or recently passed stones. Enlarged fibroid uterus.  Likely pyelonephritis, she will be given a gram of Rocephin IV, I've advised her to DC the Macrobid and initiate Keflex. Her primary care physician's urine culture should result she was noted in August of advised her to follow closely with them. We've had an extensive discussion of return precautions.  Evaluation does not show pathology that would require ongoing emergent intervention or inpatient treatment.  Pt is hemodynamically stable and mentating appropriately. Discussed findings and plan with patient/guardian, who agrees with care plan. All questions answered. Return precautions discussed and outpatient follow up given.    Final Clinical Impressions(s) / ED Diagnoses   Final diagnoses:  Pain  Abnormal ultrasound  Pyelonephritis    New Prescriptions Discharge Medication List as of 09/06/2016  1:22 PM    START taking these medications   Details  cephALEXin (KEFLEX) 500 MG capsule Take 1 capsule (500 mg total) by mouth 4 (four) times daily., Starting Sun 09/06/2016, Until Sun 09/20/2016, Print         Floraine Buechler, PA-C 09/06/16 [place], MD 09/07/16 [phone]

## 2016-09-06 NOTE — ED Triage Notes (Signed)
Pt states she has had achy right flank pain that started a week ago; pt states she thought it was UTI and was seen by Md; pt states she is unable to sleep; Pt states pain at 10/10 on arrival. Pt a&ox 4; Pt has hx of HTN and presents hypertensive at 196/107 at triage.

## 2016-09-06 NOTE — ED Notes (Signed)
Patient returned place back on monitor,

## 2016-09-07 LAB — URINE CULTURE: Culture: NO GROWTH

## 2017-06-01 LAB — TSH: TSH: 2.07 (ref 0.41–5.90)

## 2017-06-01 LAB — HM MAMMOGRAPHY

## 2017-06-02 LAB — HM PAP SMEAR: HM Pap smear: NEGATIVE

## 2018-03-09 ENCOUNTER — Ambulatory Visit: Payer: 59 | Admitting: Family Medicine

## 2018-03-09 ENCOUNTER — Encounter: Payer: Self-pay | Admitting: Family Medicine

## 2018-03-09 VITALS — BP 180/118 | HR 102 | Temp 98.2°F | Ht 66.0 in | Wt 180.8 lb

## 2018-03-09 DIAGNOSIS — E049 Nontoxic goiter, unspecified: Secondary | ICD-10-CM | POA: Diagnosis not present

## 2018-03-09 DIAGNOSIS — Z13220 Encounter for screening for lipoid disorders: Secondary | ICD-10-CM

## 2018-03-09 DIAGNOSIS — I10 Essential (primary) hypertension: Secondary | ICD-10-CM | POA: Diagnosis not present

## 2018-03-09 MED ORDER — AMLODIPINE BESYLATE 5 MG PO TABS: 5.0000 mg | ORAL_TABLET | Freq: Every day | ORAL | 2 refills | Status: DC

## 2018-03-09 MED ORDER — BLOOD PRESSURE CUFF MISC: 1.0000 | Freq: Every day | 0 refills | Status: AC

## 2018-03-09 NOTE — Patient Instructions (Addendum)
Add amlodipine to current blood pressure medication.   Let me know if any problems with medication.   I will call you with blood results when I get them and we will schedule physical at that time.   Check and record blood pressures when able. Bring cuff to appointment with you if possible.

## 2018-03-09 NOTE — Progress Notes (Signed)
Jean Bond DOB: [birth year] Encounter date: 03/09/2018  This isa 47 y.o. female who presents to establish care. Chief Complaint  Patient presents with  . Establish Care    needs to make appointment for physical    History of present illness:  Has been getting bp medication from obgyn. Has had increased stress at work. Worries about blood pressure. Hasn't been checking. Has been taking bp medication. No headaches, no chest pain; just feels really stressed. Has had elevated stress last few months. Feels that the lisinopril-hctz gives her a headache sometimes; notes this in morning. Feels that this goes away after an hour or two of being awake. Has tried hctz alone in past. Doesn't recall other bp medications (tribenzor listed but doesn't remember taking). Does have a little throat tickle/cough with the lisinopril.   Not sleeping very well. Getting only about 5-6 hours of sleep/night. Sometimes wakes with stress.   Was working out more regularly; but had abscess on buttock which slowed her down.   Had migraines in past; doesn't seem to have them now. Went away with change of job. Not getting headaches besides ones she wakes with.   Has a mole on left side of neck that sometimes gets a little scab on it; wants it checked.   Did see nephrology in the past for elevated creatinine and was told "nothing was wrong"; no follow up was needed/planned (this note was reviewed; creat did improve on recheck and they suspected lisinopril as contributing to bump in creat)  Past Medical History:  Diagnosis Date  . Headache(784.0)   . History of migraine   . Hypertension    Past Surgical History:  Procedure Laterality Date  . APPENDECTOMY    . [place]    . MYOMECTOMY VAGINAL APPROACH  2008   No Known Allergies Current Meds  Medication Sig  . desogestrel-ethinyl estradiol (ISIBLOOM) 0.15-30 MG-MCG tablet Take 1 tablet by mouth daily.   Social History   Tobacco Use  . Smoking  status: Never Smoker  Substance Use Topics  . Alcohol use: No   Family History  Problem Relation Age of Onset  . Hypertension Father   . Diabetes Father   . Kidney disease Father   . Healthy Sister   . Cancer Maternal Grandmother        colon   . Thyroid disease Mother   . Cancer Maternal Grandfather        colon     Review of Systems  Constitutional: Positive for unexpected weight change (stress causing her loss of appetite; states she has been losing weight). Negative for activity change.  Respiratory: Negative for chest tightness.   Cardiovascular: Positive for palpitations (occasionally with significant stress; gets jumpy/anxious but usually can calm with deep breahting). Negative for chest pain.  Gastrointestinal: Positive for constipation (sometimes). Negative for abdominal pain, diarrhea and vomiting.  Genitourinary: Negative for difficulty urinating and flank pain.  Neurological: Positive for headaches (very mild and limited to 1 hour in morning).  Psychiatric/Behavioral: Positive for sleep disturbance. The patient is nervous/anxious (very overwhelmed with stress at work. ).     Objective:  BP (!) 180/118   Pulse (!) 102   Temp 98.2 F (36.8 C) (Oral)   Ht 5\' 6"  (1.676 m)   Wt 180 lb 12.8 oz (82 kg)   SpO2 98%   BMI 29.18 kg/m   Weight: 180 lb 12.8 oz (82 kg)   BP Readings from Last 3 Encounters:  03/09/18 (!) 180/118  09/06/16 153/85  05/10/14 134/90   Wt Readings from Last 3 Encounters:  03/09/18 180 lb 12.8 oz (82 kg)  05/10/14 188 lb (85.3 kg)  10/04/13 189 lb 3.2 oz (85.8 kg)    Physical Exam  Constitutional: She appears well-developed and well-nourished. No distress.  Neck: Thyromegaly (thyroid feels diffusely enlarged) present.  Cardiovascular: Normal rate, regular rhythm and normal heart sounds. Exam reveals no friction rub.  No murmur heard. Pulmonary/Chest: Effort normal and breath sounds normal. No respiratory distress. She has no wheezes.  She has no rales.  Abdominal: Soft. Bowel sounds are normal. She exhibits no distension and no mass. There is no tenderness.  Lymphadenopathy:    She has no cervical adenopathy.  Skin:  Left side of neck; benign appearing skin tag 16mm in diameter.  Psychiatric: She has a normal mood and affect. Her behavior is normal. Thought content normal.    Assessment/Plan:  1. Hypertension, uncontrolled Poorly controlled on lisinopril-hydrochlorothiazide 20-12 0.5.  She may be having some cough side effects of lisinopril, so we will consider changing this medication.  However, since I feel that she will need more than one medication for blood pressure control, we are going to start amlodipine 5 mg daily at this time and then get blood work done today.  She had a history of a serum creatinine elevation that seemed to resolve on recheck, but I would like to make sure that kidney function is normal before adjusting lisinopril hydrochlorothiazide or changing to alternative medication that may affect the kidney.  Encouraged her to check blood pressures at home and record results.  We will see her back for a physical and blood pressure recheck at that time. She will let me know pill count of lisinopril-hctz when we call with results so we can plan follow up accordingly.  Advised her to call me if blood pressures at home are any higher than they were in the office today.  In addition call if any chest discomfort or headaches occur.  We discussed potential side effects of new medication, including swelling in the ankles, and she will let me know if she has any difficulty tolerating amlodipine.  2. Lipid screening Lipid profile to be checked today.  3. Enlarged thyroid TSH ordered.  Consider thyroid ultrasound pending this result.  4. Skin tag: can remove at follow up visit.   Return for pending blood work results.  Micheline Rough, MD

## 2018-03-10 LAB — CBC WITH DIFFERENTIAL/PLATELET
BASOS ABS: 0.2 10*3/uL — AB (ref 0.0–0.1)
Basophils Relative: 2.4 % (ref 0.0–3.0)
EOS ABS: 0.2 10*3/uL (ref 0.0–0.7)
Eosinophils Relative: 2.5 % (ref 0.0–5.0)
HCT: 33 % — ABNORMAL LOW (ref 36.0–46.0)
Hemoglobin: 10.7 g/dL — ABNORMAL LOW (ref 12.0–15.0)
LYMPHS ABS: 2.2 10*3/uL (ref 0.7–4.0)
Lymphocytes Relative: 28.4 % (ref 12.0–46.0)
MCHC: 32.4 g/dL (ref 30.0–36.0)
Monocytes Absolute: 0.4 10*3/uL (ref 0.1–1.0)
Monocytes Relative: 5.2 % (ref 3.0–12.0)
NEUTROS ABS: 4.8 10*3/uL (ref 1.4–7.7)
NEUTROS PCT: 61.5 % (ref 43.0–77.0)
PLATELETS: 321 10*3/uL (ref 150.0–400.0)
RBC: 4.84 Mil/uL (ref 3.87–5.11)
RDW: 15 % (ref 11.5–15.5)
WBC: 7.8 10*3/uL (ref 4.0–10.5)

## 2018-03-10 LAB — COMPREHENSIVE METABOLIC PANEL
ALT: 9 U/L (ref 0–35)
AST: 12 U/L (ref 0–37)
Albumin: 4 g/dL (ref 3.5–5.2)
Alkaline Phosphatase: 56 U/L (ref 39–117)
BILIRUBIN TOTAL: 0.5 mg/dL (ref 0.2–1.2)
BUN: 8 mg/dL (ref 6–23)
CHLORIDE: 102 meq/L (ref 96–112)
CO2: 26 meq/L (ref 19–32)
Calcium: 9.2 mg/dL (ref 8.4–10.5)
Creatinine, Ser: 1.37 mg/dL — ABNORMAL HIGH (ref 0.40–1.20)
GFR: 52.72 mL/min — ABNORMAL LOW (ref >60.00)
GLUCOSE: 95 mg/dL (ref 70–99)
Potassium: 3 mEq/L — ABNORMAL LOW (ref 3.5–5.1)
Sodium: 138 mEq/L (ref 135–145)
Total Protein: 7.7 g/dL (ref 6.0–8.3)

## 2018-03-10 LAB — LIPID PANEL
CHOL/HDL RATIO: 3
Cholesterol: 154 mg/dL (ref 0–200)
HDL: 58.1 mg/dL (ref >39.00)
LDL CALC: 67 mg/dL (ref 0–99)
NonHDL: 95.87
TRIGLYCERIDES: 143 mg/dL (ref 0.0–149.0)
VLDL: 28.6 mg/dL (ref 0.0–40.0)

## 2018-03-10 LAB — TSH: TSH: 2.35 u[IU]/mL (ref 0.35–4.50)

## 2018-03-11 ENCOUNTER — Other Ambulatory Visit: Payer: Self-pay | Admitting: Family Medicine

## 2018-03-11 ENCOUNTER — Telehealth: Payer: Self-pay

## 2018-03-11 ENCOUNTER — Other Ambulatory Visit (INDEPENDENT_AMBULATORY_CARE_PROVIDER_SITE_OTHER): Payer: 59

## 2018-03-11 DIAGNOSIS — D649 Anemia, unspecified: Secondary | ICD-10-CM

## 2018-03-11 LAB — IBC PANEL
Iron: 46 ug/dL (ref 42–145)
SATURATION RATIOS: 10.5 % — AB (ref 20.0–50.0)
TRANSFERRIN: 314 mg/dL (ref 212.0–360.0)

## 2018-03-11 LAB — FERRITIN: FERRITIN: 15.1 ng/mL (ref 10.0–291.0)

## 2018-03-11 LAB — IRON: Iron: 46 ug/dL (ref 42–145)

## 2018-03-11 MED ORDER — POTASSIUM CHLORIDE CRYS ER 20 MEQ PO TBCR: 20.0000 meq | EXTENDED_RELEASE_TABLET | Freq: Every day | ORAL | 0 refills | Status: DC

## 2018-03-11 NOTE — Telephone Encounter (Signed)
Noted  

## 2018-03-11 NOTE — Telephone Encounter (Signed)
Pt. Given results and instructions. Appointment made for a physical next week.

## 2018-03-15 ENCOUNTER — Ambulatory Visit: Payer: Managed Care, Other (non HMO) | Admitting: Internal Medicine

## 2018-03-16 ENCOUNTER — Ambulatory Visit (INDEPENDENT_AMBULATORY_CARE_PROVIDER_SITE_OTHER): Payer: 59 | Admitting: Family Medicine

## 2018-03-16 ENCOUNTER — Encounter: Payer: Self-pay | Admitting: Family Medicine

## 2018-03-16 VITALS — BP 140/88 | HR 94 | Temp 98.2°F | Ht 65.75 in | Wt 180.9 lb

## 2018-03-16 DIAGNOSIS — Z0000 Encounter for general adult medical examination without abnormal findings: Secondary | ICD-10-CM

## 2018-03-16 DIAGNOSIS — E876 Hypokalemia: Secondary | ICD-10-CM

## 2018-03-16 DIAGNOSIS — D509 Iron deficiency anemia, unspecified: Secondary | ICD-10-CM | POA: Diagnosis not present

## 2018-03-16 DIAGNOSIS — Z1211 Encounter for screening for malignant neoplasm of colon: Secondary | ICD-10-CM

## 2018-03-16 DIAGNOSIS — I10 Essential (primary) hypertension: Secondary | ICD-10-CM | POA: Diagnosis not present

## 2018-03-16 DIAGNOSIS — N183 Chronic kidney disease, stage 3 unspecified: Secondary | ICD-10-CM

## 2018-03-16 MED ORDER — VALSARTAN 160 MG PO TABS: 160.0000 mg | ORAL_TABLET | Freq: Every day | ORAL | 2 refills | Status: DC

## 2018-03-16 NOTE — Progress Notes (Signed)
Jean Bond DOB: Dec 07, 1970 Encounter date: 03/16/2018  This is a 47 y.o. female who presents for complete physical   History of present illness/Additional concerns:  Has been a little more fatigued since starting the norvasc at last visit.  She is not certain that this is from her medication since she does have a lot of other stress factors related to her job.  Still has had morning headache, but still there.   Menses 3-4 days typically. One heavy day: changes q 3 hours; some blood clots.  Has been this way for years.  Past Medical History:  Diagnosis Date  . Headache(784.0)   . History of migraine   . Hypertension    Past Surgical History:  Procedure Laterality Date  . APPENDECTOMY    . [place]    . MYOMECTOMY VAGINAL APPROACH  2008   No Known Allergies Current Meds  Medication Sig  . amLODipine (NORVASC) 5 MG tablet Take 1 tablet (5 mg total) by mouth daily.  . Blood Pressure Monitoring (BLOOD PRESSURE CUFF) MISC 1 Device by Does not apply route daily.  Marland Kitchen desogestrel-ethinyl estradiol (ISIBLOOM) 0.15-30 MG-MCG tablet Take 1 tablet by mouth daily.  . Multiple Vitamin (MULTIVITAMIN) tablet Take 1 tablet by mouth daily.  . potassium chloride SA (K-DUR,KLOR-CON) 20 MEQ tablet Take 1 tablet (20 mEq total) by mouth daily for 7 days.   Social History   Tobacco Use  . Smoking status: Never Smoker  . Smokeless tobacco: Never Used  Substance Use Topics  . Alcohol use: No   Family History  Problem Relation Age of Onset  . Hypertension Father   . Diabetes Father   . Kidney disease Father   . Healthy Sister   . Cancer Maternal Grandmother        colon   . Thyroid disease Mother   . Cancer Maternal Grandfather        colon     Review of Systems  Constitutional: Positive for fatigue. Negative for activity change, appetite change, chills, fever and unexpected weight change.  HENT: Negative for congestion, ear pain, hearing loss, sinus pressure, sinus pain,  sore throat and trouble swallowing.   Eyes: Negative for pain and visual disturbance.  Respiratory: Negative for cough, chest tightness, shortness of breath and wheezing.   Cardiovascular: Negative for chest pain, palpitations and leg swelling.  Gastrointestinal: Negative for abdominal pain, blood in stool, constipation, diarrhea, nausea and vomiting.  Genitourinary: Negative for difficulty urinating and menstrual problem.       Does get some discomfort from fibroids occasionally.  Musculoskeletal: Negative for arthralgias and back pain.  Skin: Negative for rash.  Neurological: Negative for dizziness, weakness, numbness and headaches.  Hematological: Negative for adenopathy. Does not bruise/bleed easily.  Psychiatric/Behavioral: Negative for sleep disturbance and suicidal ideas. The patient is not nervous/anxious.     CBC:  Lab Results  Component Value Date   WBC 7.8 03/09/2018   HGB 10.7 (L) 03/09/2018   HCT 33.0 (L) 03/09/2018   MCH 22.7 (L) 09/06/2016   MCHC 32.4 03/09/2018   RDW 15.0 03/09/2018   PLT 321.0 03/09/2018   MPV 10.0 05/06/2013   CMP: Lab Results  Component Value Date   NA 138 03/09/2018   NA 138 05/03/2014   K 3.0 (L) 03/09/2018   CL 102 03/09/2018   CO2 26 03/09/2018   ANIONGAP 14 09/06/2016   GLUCOSE 95 03/09/2018   BUN 8 03/09/2018   BUN 15 05/03/2014   CREATININE 1.37 (H) 03/09/2018  CREATININE 1.16 (H) 05/06/2013   GFRAA >60 09/06/2016   CALCIUM 9.2 03/09/2018   PROT 7.7 03/09/2018   BILITOT 0.5 03/09/2018   ALKPHOS 56 03/09/2018   ALT 9 03/09/2018   AST 12 03/09/2018   LIPID: Lab Results  Component Value Date   CHOL 154 03/09/2018   TRIG 143.0 03/09/2018   HDL 58.10 03/09/2018   LDLCALC 67 03/09/2018    Objective:  BP 140/88 (BP Location: Left Arm, Patient Position: Sitting, Cuff Size: Normal)   Pulse 94   Temp 98.2 F (36.8 C) (Oral)   Ht 5' 5.75" (1.67 m)   Wt 180 lb 14.4 oz (82.1 kg)   SpO2 100%   BMI 29.42 kg/m   Weight:  180 lb 14.4 oz (82.1 kg)   BP Readings from Last 3 Encounters:  03/16/18 140/88  03/09/18 (!) 180/118  09/06/16 153/85   Wt Readings from Last 3 Encounters:  03/16/18 180 lb 14.4 oz (82.1 kg)  03/09/18 180 lb 12.8 oz (82 kg)  05/10/14 188 lb (85.3 kg)    Physical Exam  Constitutional: She is oriented to person, place, and time. She appears well-developed and well-nourished. No distress.  HENT:  Head: Normocephalic and atraumatic.  Right Ear: External ear normal.  Left Ear: External ear normal.  Mouth/Throat: Oropharynx is clear and moist. No oropharyngeal exudate.  Eyes: Pupils are equal, round, and reactive to light. Conjunctivae are normal.  Neck: Normal range of motion. Neck supple. No thyromegaly present.  Slightly enlarged thyroid; no nodules appreciated.   Cardiovascular: Normal rate and regular rhythm. Exam reveals no gallop and no friction rub.  Murmur heard.  Systolic murmur is present with a grade of 2/6. Pulmonary/Chest: Effort normal and breath sounds normal.  Abdominal: Soft. Bowel sounds are normal. She exhibits no distension and no mass. There is no tenderness. There is no guarding. No hernia.  Musculoskeletal: Normal range of motion. She exhibits no edema, tenderness or deformity.  Lymphadenopathy:    She has no cervical adenopathy.  Neurological: She is alert and oriented to person, place, and time. She has normal strength. She displays normal reflexes.  Reflex Scores:      Tricep reflexes are 2+ on the right side and 2+ on the left side.      Bicep reflexes are 2+ on the right side and 2+ on the left side.      Brachioradialis reflexes are 2+ on the right side and 2+ on the left side.      Patellar reflexes are 2+ on the right side and 2+ on the left side. Skin: Skin is warm and dry. No rash noted.  Scattered freckles torso. There is one 0.5cm x 0.25cm hyperpigmented macule upper right back. Patient states this has been present for years and that there is not  notable change in color or size.  Psychiatric: She has a normal mood and affect. Her speech is normal and behavior is normal. Thought content normal.    Assessment/Plan: Health Maintenance Due  Topic Date Due  . HIV Screening  04/11/1984  . TETANUS/TDAP  04/11/1988  . INFLUENZA VACCINE  03/03/2018   Health Maintenance reviewed - colonoscopy referral placed.  1. Preventative health care Colonoscopy referral placed; will try to get obgyn records/mammogram records.  2. Essential hypertension Blood pressure has improved on norvasc (started a week ago). Since she has noted tickle in throat and headache with lisinopril, we are going to stop lisinopril-hctz and start diovan in hopes that we can get  her on single combo amlodipine-valsartan in the future. Her creat is slightly bumped; hoping that with leaving off the hctz and with improved bp control this will improve. We will recheck bmp in 2 months time.   I am hopeful that as blood pressure normalizes and she adjusts to this new normal level, that fatigue will improve. - valsartan (DIOVAN) 160 MG tablet; Take 1 tablet (160 mg total) by mouth daily.  Dispense: 30 tablet; Refill: 2  3. Iron deficiency anemia, unspecified iron deficiency anemia type She is going to look into taking iron supplement OTC. High iron diet information was also given in the office today.  Additionally referral for colonoscopy has been placed to make sure there is no blood loss through the stool.  Periods may be a source of iron deficiency as well. - CBC with Differential/Platelet; Future - Ferritin; Future  4. CKD (chronic kidney disease), stage III (HCC) Creatinine has been up and down in the past.  She has seen nephrology for evaluation in the past.  If there is no improvement in creatinine with recheck in 2 months time we can discuss repeat referral. - Basic metabolic panel; Future  5. Encounter for screening colonoscopy - Ambulatory referral to  Gastroenterology  6. Hypokalemia She is taking potassium supplement as recommended. We discussed higher potassium diet as she seems to be chronically low in spite of being on lisinopril. Recommended daily MVI with magnesium.   Return in about 2 months (around 05/16/2018). for blood pressure recheck and lab review.  Micheline Rough, MD

## 2018-03-16 NOTE — Patient Instructions (Addendum)
Stop the lisinopril-hctz. Continue the amlodipine 5mg daily. Start valsartan 160mg daily. Record blood pressures at home. Follow up in office in 2 months time; sooner if concerns.    DASH Eating Plan DASH stands for "Dietary Approaches to Stop Hypertension." The DASH eating plan is a healthy eating plan that has been shown to reduce high blood pressure (hypertension). It may also reduce your risk for type 2 diabetes, heart disease, and stroke. The DASH eating plan may also help with weight loss. What are tips for following this plan? General guidelines  Avoid eating more than 2,300 mg (milligrams) of salt (sodium) a day. If you have hypertension, you may need to reduce your sodium intake to 1,500 mg a day.  Limit alcohol intake to no more than 1 drink a day for nonpregnant women and 2 drinks a day for men. One drink equals 12 oz of beer, 5 oz of wine, or 1 oz of hard liquor.  Work with your health care provider to maintain a healthy body weight or to lose weight. Ask what an ideal weight is for you.  Get at least 30 minutes of exercise that causes your heart to beat faster (aerobic exercise) most days of the week. Activities may include walking, swimming, or biking.  Work with your health care provider or diet and nutrition specialist (dietitian) to adjust your eating plan to your individual calorie needs. Reading food labels  Check food labels for the amount of sodium per serving. Choose foods with less than 5 percent of the Daily Value of sodium. Generally, foods with less than 300 mg of sodium per serving fit into this eating plan.  To find whole grains, look for the word "whole" as the first word in the ingredient list. Shopping  Buy products labeled as "low-sodium" or "no salt added."  Buy fresh foods. Avoid canned foods and premade or frozen meals. Cooking  Avoid adding salt when cooking. Use salt-free seasonings or herbs instead of table salt or sea salt. Check with your health  care provider or pharmacist before using salt substitutes.  Do not fry foods. Cook foods using healthy methods such as baking, boiling, grilling, and broiling instead.  Cook with heart-healthy oils, such as olive, canola, soybean, or sunflower oil. Meal planning   Eat a balanced diet that includes: ? 5 or more servings of fruits and vegetables each day. At each meal, try to fill half of your plate with fruits and vegetables. ? Up to 6-8 servings of whole grains each day. ? Less than 6 oz of lean meat, poultry, or fish each day. A 3-oz serving of meat is about the same size as a deck of cards. One egg equals 1 oz. ? 2 servings of low-fat dairy each day. ? A serving of nuts, seeds, or beans 5 times each week. ? Heart-healthy fats. Healthy fats called Omega-3 fatty acids are found in foods such as flaxseeds and coldwater fish, like sardines, salmon, and mackerel.  Limit how much you eat of the following: ? Canned or prepackaged foods. ? Food that is high in trans fat, such as fried foods. ? Food that is high in saturated fat, such as fatty meat. ? Sweets, desserts, sugary drinks, and other foods with added sugar. ? Full-fat dairy products.  Do not salt foods before eating.  Try to eat at least 2 vegetarian meals each week.  Eat more home-cooked food and less restaurant, buffet, and fast food.  When eating at a restaurant, ask that   your food be prepared with less salt or no salt, if possible. What foods are recommended? The items listed may not be a complete list. Talk with your dietitian about what dietary choices are best for you. Grains Whole-grain or whole-wheat bread. Whole-grain or whole-wheat pasta. Brown rice. [place]. Bulgur. Whole-grain and low-sodium cereals. Pita bread. Low-fat, low-sodium crackers. Whole-wheat flour tortillas. Vegetables Fresh or frozen vegetables (raw, steamed, roasted, or grilled). Low-sodium or reduced-sodium tomato and vegetable juice.  Low-sodium or reduced-sodium tomato sauce and tomato paste. Low-sodium or reduced-sodium canned vegetables. Fruits All fresh, dried, or frozen fruit. Canned fruit in natural juice (without added sugar). Meat and other protein foods Skinless chicken or [place]. Ground chicken or [place]. Pork with fat trimmed off. Fish and seafood. Egg whites. Dried beans, peas, or lentils. Unsalted nuts, nut butters, and seeds. Unsalted canned beans. Lean cuts of beef with fat trimmed off. Low-sodium, lean deli meat. Dairy Low-fat (1%) or fat-free (skim) milk. Fat-free, low-fat, or reduced-fat cheeses. Nonfat, low-sodium ricotta or cottage cheese. Low-fat or nonfat yogurt. Low-fat, low-sodium cheese. Fats and oils Soft margarine without trans fats. Vegetable oil. Low-fat, reduced-fat, or light mayonnaise and salad dressings (reduced-sodium). Canola, safflower, olive, soybean, and sunflower oils. Avocado. Seasoning and other foods Herbs. Spices. Seasoning mixes without salt. Unsalted popcorn and pretzels. Fat-free sweets. What foods are not recommended? The items listed may not be a complete list. Talk with your dietitian about what dietary choices are best for you. Grains Baked goods made with fat, such as croissants, muffins, or some breads. Dry pasta or rice meal packs. Vegetables Creamed or fried vegetables. Vegetables in a cheese sauce. Regular canned vegetables (not low-sodium or reduced-sodium). Regular canned tomato sauce and paste (not low-sodium or reduced-sodium). Regular tomato and vegetable juice (not low-sodium or reduced-sodium). Angie Fava. Olives. Fruits Canned fruit in a light or heavy syrup. Fried fruit. Fruit in cream or butter sauce. Meat and other protein foods Fatty cuts of meat. Ribs. Fried meat. Berniece Salines. Sausage. Bologna and other processed lunch meats. Salami. Fatback. Hotdogs. Bratwurst. Salted nuts and seeds. Canned beans with added salt. Canned or smoked fish. Whole eggs or egg yolks. Chicken  or [place] with skin. Dairy Whole or 2% milk, cream, and half-and-half. Whole or full-fat cream cheese. Whole-fat or sweetened yogurt. Full-fat cheese. Nondairy creamers. Whipped toppings. Processed cheese and cheese spreads. Fats and oils Butter. Stick margarine. Lard. Shortening. Ghee. Bacon fat. Tropical oils, such as coconut, palm kernel, or palm oil. Seasoning and other foods Salted popcorn and pretzels. Onion salt, garlic salt, seasoned salt, table salt, and sea salt. Worcestershire sauce. Tartar sauce. Barbecue sauce. Teriyaki sauce. Soy sauce, including reduced-sodium. Steak sauce. Canned and packaged gravies. Fish sauce. Oyster sauce. Cocktail sauce. Horseradish that you find on the shelf. Ketchup. Mustard. Meat flavorings and tenderizers. Bouillon cubes. Hot sauce and Tabasco sauce. Premade or packaged marinades. Premade or packaged taco seasonings. Relishes. Regular salad dressings. Where to find more information:  National Heart, Lung, and [place]: https://wilson-eaton.com/  American Heart Association: www.heart.org Summary  The DASH eating plan is a healthy eating plan that has been shown to reduce high blood pressure (hypertension). It may also reduce your risk for type 2 diabetes, heart disease, and stroke.  With the DASH eating plan, you should limit salt (sodium) intake to 2,300 mg a day. If you have hypertension, you may need to reduce your sodium intake to 1,500 mg a day.  When on the DASH eating plan, aim to eat more fresh fruits and  vegetables, whole grains, lean proteins, low-fat dairy, and heart-healthy fats.  Work with your health care provider or diet and nutrition specialist (dietitian) to adjust your eating plan to your individual calorie needs. This information is not intended to replace advice given to you by your health care provider. Make sure you discuss any questions you have with your health care provider. Document Released: 07/09/2011 Document Revised:  07/13/2016 Document Reviewed: 07/13/2016 Elsevier Interactive Patient Education  2018 [employer].  Iron Deficiency Anemia, Adult Iron deficiency anemia is a condition in which the concentration of red blood cells or hemoglobin in the blood is below normal because of too little iron. Hemoglobin is a substance in red blood cells that carries oxygen to the body's tissues. When the concentration of red blood cells or hemoglobin is too low, not enough oxygen reaches these tissues. Iron deficiency anemia is usually long-lasting (chronic) and it develops over time. It may or may not cause symptoms. It is a common type of anemia. What are the causes? This condition may be caused by:  Not enough iron in the diet.  Blood loss caused by bleeding in the intestine.  Blood loss from a gastrointestinal condition like Crohn disease.  Frequent blood draws, such as from blood donation.  Abnormal absorption in the gut.  Heavy menstrual periods in women.  Cancers of the gastrointestinal system, such as colon cancer.  What are the signs or symptoms? Symptoms of this condition may include:  Fatigue.  Headache.  Pale skin, lips, and nail beds.  Poor appetite.  Weakness.  Shortness of breath.  Dizziness.  Cold hands and feet.  Fast or irregular heartbeat.  Irritability. This is more common in severe anemia.  Rapid breathing. This is more common in severe anemia.  Mild anemia may not cause any symptoms. How is this diagnosed? This condition is diagnosed based on:  Your medical history.  A physical exam.  Blood tests.  You may have additional tests to find the underlying cause of your anemia, such as:  Testing for blood in the stool (fecal occult blood test).  A procedure to see inside your colon and rectum (colonoscopy).  A procedure to see inside your esophagus and stomach (endoscopy).  A test in which cells are removed from bone marrow (bone marrow aspiration) or fluid  is removed from the bone marrow to be examined (biopsy). This is rarely needed.  How is this treated? This condition is treated by correcting the cause of your iron deficiency. Treatment may involve:  Adding iron-rich foods to your diet.  Taking iron supplements. If you are pregnant or breastfeeding, you may need to take extra iron because your normal diet usually does not provide the amount of iron that you need.  Increasing vitamin C intake. Vitamin C helps your body absorb iron. Your health care provider may recommend that you take iron supplements along with a glass of orange juice or a vitamin C supplement.  Medicines to make heavy menstrual flow lighter.  Surgery.  You may need repeat blood tests to determine whether treatment is working. Depending on the underlying cause, the anemia should be corrected within 2 months of starting treatment. If the treatment does not seem to be working, you may need more testing. Follow these instructions at home: Medicines  Take over-the-counter and prescription medicines only as told by your health care provider. This includes iron supplements and vitamins.  If you cannot tolerate taking iron supplements by mouth, talk with your health care provider  about taking them through a vein (intravenously) or an injection into a muscle.  For the best iron absorption, you should take iron supplements when your stomach is empty. If you cannot tolerate them on an empty stomach, you may need to take them with food.  Do not drink milk or take antacids at the same time as your iron supplements. Milk and antacids may interfere with iron absorption.  Iron supplements can cause constipation. To prevent constipation, include fiber in your diet as told by your health care provider. A stool softener may also be recommended. Eating and drinking  Talk with your health care provider before changing your diet. He or she may recommend that you eat foods that contain a  lot of iron, such as: ? Liver. ? Low-fat (lean) beef. ? Breads and cereals that have iron added to them (are fortified). ? Eggs. ? Dried fruit. ? Dark green, leafy vegetables.  To help your body use the iron from iron-rich foods, eat those foods at the same time as fresh fruits and vegetables that are high in vitamin C. Foods that are high in vitamin C include: ? Oranges. ? Peppers. ? Tomatoes. ? Mangoes.  Drinkenoughfluid to keep your urine clear or pale yellow. General instructions  Return to your normal activities as told by your health care provider. Ask your health care provider what activities are safe for you.  Practice good hygiene. Anemia can make you more prone to illness and infection.  Keep all follow-up visits as told by your health care provider. This is important. Contact a health care provider if:  You feel nauseous or you vomit.  You feel weak.  You have unexplained sweating.  You develop symptoms of constipation, such as: ? Having fewer than three bowel movements a week. ? Straining to have a bowel movement. ? Having stools that are hard, dry, or larger than normal. ? Feeling full or bloated. ? Pain in the lower abdomen. ? Not feeling relief after having a bowel movement. Get help right away if:  You faint. If this happens, do not drive yourself to the hospital. Call your local emergency services (911 in the [place]  You have chest pain.  You have shortness of breath that: ? Is severe. ? Gets worse with physical activity.  You have a rapid heartbeat.  You become light-headed when getting up from a sitting or lying down position. This information is not intended to replace advice given to you by your health care provider. Make sure you discuss any questions you have with your health care provider. Document Released: 07/17/2000 Document Revised: 04/08/2016 Document Reviewed: 04/08/2016 Elsevier Interactive Patient Education  2018 [employer].  Hypokalemia Hypokalemia means that the amount of potassium in the blood is lower than normal.Potassium is a chemical that helps regulate the amount of fluid in the body (electrolyte). It also stimulates muscle tightening (contraction) and helps nerves work properly.Normally, most of the body's potassium is inside of cells, and only a very small amount is in the blood. Because the amount in the blood is so small, minor changes to potassium levels in the blood can be life-threatening. What are the causes? This condition may be caused by:  Antibiotic medicine.  Diarrhea or vomiting. Taking too much of a medicine that helps you have a bowel movement (laxative) can cause diarrhea and lead to hypokalemia.  Chronic kidney disease (CKD).  Medicines that help the body get rid of excess fluid (diuretics).  Eating disorders, such as  bulimia.  Low magnesium levels in the body.  Sweating a lot.  What are the signs or symptoms? Symptoms of this condition include:  Weakness.  Constipation.  Fatigue.  Muscle cramps.  Mental confusion.  Skipped heartbeats or irregular heartbeat (palpitations).  Tingling or numbness.  How is this diagnosed? This condition is diagnosed with a blood test. How is this treated? Hypokalemia can be treated by taking potassium supplements by mouth or adjusting the medicines that you take. Treatment may also include eating more foods that contain a lot of potassium. If your potassium level is very low, you may need to get potassium through an IV tube in one of your veins and be monitored in the hospital. Follow these instructions at home:  Take over-the-counter and prescription medicines only as told by your health care provider. This includes vitamins and supplements.  Eat a healthy diet. A healthy diet includes fresh fruits and vegetables, whole grains, healthy fats, and lean proteins.  If instructed, eat more foods that contain a lot of potassium,  such as: ? Nuts, such as peanuts and pistachios. ? Seeds, such as sunflower seeds and pumpkin seeds. ? Peas, lentils, and lima beans. ? Whole grain and bran cereals and breads. ? Fresh fruits and vegetables, such as apricots, avocado, bananas, cantaloupe, kiwi, oranges, tomatoes, asparagus, and potatoes. ? Orange juice. ? Tomato juice. ? Red meats. ? Yogurt.  Keep all follow-up visits as told by your health care provider. This is important. Contact a health care provider if:  You have weakness that gets worse.  You feel your heart pounding or racing.  You vomit.  You have diarrhea.  You have diabetes (diabetes mellitus) and you have trouble keeping your blood sugar (glucose) in your target range. Get help right away if:  You have chest pain.  You have shortness of breath.  You have vomiting or diarrhea that lasts for more than 2 days.  You faint. This information is not intended to replace advice given to you by your health care provider. Make sure you discuss any questions you have with your health care provider. Document Released: 07/20/2005 Document Revised: 03/07/2016 Document Reviewed: 03/07/2016 Elsevier Interactive Patient Education  2018 [employer].  Iron-Rich Diet Iron is a mineral that helps your body to produce hemoglobin. Hemoglobin is a protein in your red blood cells that carries oxygen to your body's tissues. Eating too little iron may cause you to feel weak and tired, and it can increase your risk for infection. Eating enough iron is necessary for your body's metabolism, muscle function, and nervous system. Iron is naturally found in many foods. It can also be added to foods or fortified in foods. There are two types of dietary iron:  Heme iron. Heme iron is absorbed by the body more easily than nonheme iron. Heme iron is found in meat, poultry, and fish.  Nonheme iron. Nonheme iron is found in dietary supplements, iron-fortified grains, beans, and  vegetables.  You may need to follow an iron-rich diet if:  You have been diagnosed with iron deficiency or iron-deficiency anemia.  You have a condition that prevents you from absorbing dietary iron, such as: ? Infection in your intestines. ? Celiac disease. This involves long-lasting (chronic) inflammation of your intestines.  You do not eat enough iron.  You eat a diet that is high in foods that impair iron absorption.  You have lost a lot of blood.  You have heavy bleeding during your menstrual cycle.  You are  pregnant.  What is my plan? Your health care provider may help you to determine how much iron you need per day based on your condition. Generally, when a person consumes sufficient amounts of iron in the diet, the following iron needs are met:  Men. ? 14-18 years old: 11 mg per day. ? 19-50 years old: 8 mg per day.  Women. ? 14-18 years old: 15 mg per day. ? 19-50 years old: 18 mg per day. ? Over 50 years old: 8 mg per day. ? Pregnant women: 27 mg per day. ? Breastfeeding women: 9 mg per day.  What do I need to know about an iron-rich diet?  Eat fresh fruits and vegetables that are high in vitamin C along with foods that are high in iron. This will help increase the amount of iron that your body absorbs from food, especially with foods containing nonheme iron. Foods that are high in vitamin C include oranges, peppers, tomatoes, and mango.  Take iron supplements only as directed by your health care provider. Overdose of iron can be life-threatening. If you were prescribed iron supplements, take them with orange juice or a vitamin C supplement.  Cook foods in pots and pans that are made from iron.  Eat nonheme iron-containing foods alongside foods that are high in heme iron. This helps to improve your iron absorption.  Certain foods and drinks contain compounds that impair iron absorption. Avoid eating these foods in the same meal as iron-rich foods or with iron  supplements. These include: ? Coffee, black tea, and red wine. ? Milk, dairy products, and foods that are high in calcium. ? Beans, soybeans, and peas. ? Whole grains.  When eating foods that contain both nonheme iron and compounds that impair iron absorption, follow these tips to absorb iron better. ? Soak beans overnight before cooking. ? Soak whole grains overnight and drain them before using. ? Ferment flours before baking, such as using yeast in bread dough. What foods can I eat? Grains Iron-fortified breakfast cereal. Iron-fortified whole-wheat bread. Enriched rice. Sprouted grains. Vegetables Spinach. Potatoes with skin. Green peas. Broccoli. Red and green bell peppers. Fermented vegetables. Fruits Prunes. Raisins. Oranges. Strawberries. Mango. Grapefruit. Meats and Other Protein Sources Beef liver. Oysters. Beef. Shrimp. Turkey. Chicken. Tuna. Sardines. Chickpeas. Nuts. Tofu. Beverages Tomato juice. Fresh orange juice. Prune juice. Hibiscus tea. Fortified instant breakfast shakes. Condiments Tahini. Fermented soy sauce. Sweets and Desserts Black-strap molasses. Other Wheat germ. The items listed above may not be a complete list of recommended foods or beverages. Contact your dietitian for more options. What foods are not recommended? Grains Whole grains. Bran cereal. Bran flour. Oats. Vegetables Artichokes. Brussels sprouts. Kale. Fruits Blueberries. Raspberries. Strawberries. Figs. Meats and Other Protein Sources Soybeans. Products made from soy protein. Dairy Milk. Cream. Cheese. Yogurt. Cottage cheese. Beverages Coffee. Black tea. Red wine. Sweets and Desserts Cocoa. Chocolate. Ice cream. Other Basil. Oregano. Parsley. The items listed above may not be a complete list of foods and beverages to avoid. Contact your dietitian for more information. This information is not intended to replace advice given to you by your health care provider. Make sure you discuss  any questions you have with your health care provider. Document Released: 03/03/2005 Document Revised: 02/07/2016 Document Reviewed: 02/14/2014 Elsevier Interactive Patient Education  2018 [employer] Inc[employer]  

## 2018-03-30 ENCOUNTER — Encounter: Payer: Managed Care, Other (non HMO) | Admitting: Internal Medicine

## 2018-04-15 DIAGNOSIS — H401230 Low-tension glaucoma, bilateral, stage unspecified: Secondary | ICD-10-CM | POA: Diagnosis not present

## 2018-04-21 ENCOUNTER — Telehealth: Payer: Self-pay | Admitting: Family Medicine

## 2018-04-21 NOTE — Telephone Encounter (Signed)
Copied from [place] [id number]. Topic: Quick Communication - See Telephone Encounter >> Apr 21, 2018 11:03 AM Ivar Drape wrote: CRM for notification. See Telephone encounter for: 04/21/18. Patient stated since taking the blood pressure meds she was prescribed by the provider, she has been feeling sluggish, sleepy and tired and that is not her.  Please advise.

## 2018-04-21 NOTE — Telephone Encounter (Signed)
How are blood pressures doing? Sometimes if running too low she might feel more sluggish.   She had some complaints of fatigue after starting the norvasc. Does she feel like it was worse with the diovan addition? Or just not improved since start of norvasc. Just trying to pinpoint which medication is contributing.   Finally; when is she taking medication?

## 2018-04-21 NOTE — Telephone Encounter (Signed)
Spoke with patient she stated that she spoke to Dr. Ethlyn Gallery about having a headache first thing in the morning but now says that it is lasting longer into the day. She does not have a BP machine at home but checks it when she goes out and it averages 130/90. She feels that the tiredness and sluggishness is worse now that when she was on Norvasc. She is taking the medication at night before bedtime.

## 2018-04-22 ENCOUNTER — Other Ambulatory Visit (INDEPENDENT_AMBULATORY_CARE_PROVIDER_SITE_OTHER): Payer: 59

## 2018-04-22 DIAGNOSIS — N183 Chronic kidney disease, stage 3 unspecified: Secondary | ICD-10-CM

## 2018-04-22 DIAGNOSIS — D509 Iron deficiency anemia, unspecified: Secondary | ICD-10-CM | POA: Diagnosis not present

## 2018-04-22 LAB — BASIC METABOLIC PANEL
BUN: 8 mg/dL (ref 6–23)
CO2: 26 meq/L (ref 19–32)
Calcium: 9.1 mg/dL (ref 8.4–10.5)
Chloride: 106 mEq/L (ref 96–112)
Creatinine, Ser: 1.16 mg/dL (ref 0.40–1.20)
GFR: 63.85 mL/min (ref >60.00)
GLUCOSE: 90 mg/dL (ref 70–99)
POTASSIUM: 3.7 meq/L (ref 3.5–5.1)
Sodium: 141 mEq/L (ref 135–145)

## 2018-04-22 LAB — CBC WITH DIFFERENTIAL/PLATELET
Basophils Absolute: 0.1 10*3/uL (ref 0.0–0.1)
Basophils Relative: 0.9 % (ref 0.0–3.0)
Eosinophils Absolute: 0.2 10*3/uL (ref 0.0–0.7)
Eosinophils Relative: 2.9 % (ref 0.0–5.0)
HCT: 35.3 % — ABNORMAL LOW (ref 36.0–46.0)
HEMOGLOBIN: 11.3 g/dL — AB (ref 12.0–15.0)
LYMPHS ABS: 2.2 10*3/uL (ref 0.7–4.0)
Lymphocytes Relative: 28.9 % (ref 12.0–46.0)
MCHC: 32.2 g/dL (ref 30.0–36.0)
Monocytes Absolute: 0.3 10*3/uL (ref 0.1–1.0)
Monocytes Relative: 4.1 % (ref 3.0–12.0)
NEUTROS PCT: 63.2 % (ref 43.0–77.0)
Neutro Abs: 4.8 10*3/uL (ref 1.4–7.7)
Platelets: 337 10*3/uL (ref 150.0–400.0)
RBC: 5.18 Mil/uL — AB (ref 3.87–5.11)
RDW: 15.1 % (ref 11.5–15.5)
WBC: 7.6 10*3/uL (ref 4.0–10.5)

## 2018-04-22 LAB — FERRITIN: Ferritin: 7.2 ng/mL — ABNORMAL LOW (ref 10.0–291.0)

## 2018-04-22 NOTE — Telephone Encounter (Signed)
Spoke with patient, she is aware to stop the Diovan and take two 5mg  tablets of Norvasc at bed time for a total of 10mg . Patient was asked to call back if symptoms continue or worsen.

## 2018-04-22 NOTE — Telephone Encounter (Signed)
Glad she let us know about side effects. I really want to work with her to get good control of blood pressure but with a medication she can tolerate.   Just make sure she is taking both the norvasc and the diovan.   Since she is feeling worse with diovan addition (and had headaches associated with sister med, lisinopril), lets stop that and then double up on the norvasc. She can take the full 10mg  of norvasc at bedtime. Let's see how this goes for a couple of weeks. If any worsening of symptoms let me know. If headaches do not resolve and fatigue not improved in 2 weeks let me know.

## 2018-04-22 NOTE — Telephone Encounter (Signed)
Noted  

## 2018-04-25 ENCOUNTER — Other Ambulatory Visit: Payer: Self-pay | Admitting: Family Medicine

## 2018-04-25 ENCOUNTER — Ambulatory Visit: Payer: Self-pay | Admitting: Family Medicine

## 2018-04-25 DIAGNOSIS — I10 Essential (primary) hypertension: Secondary | ICD-10-CM

## 2018-04-25 MED ORDER — LISINOPRIL-HYDROCHLOROTHIAZIDE 20-25 MG PO TABS: 2.0000 | ORAL_TABLET | Freq: Every day | ORAL | 2 refills | Status: DC

## 2018-04-25 NOTE — Telephone Encounter (Signed)
Called in c/o BP being elevated over the weekend and this morning.   BP this morning is 174/114.  C/O having a headache and not feeling well.   Her medications were adjusted recently.    She is requesting to go back on the Lisinopril and HCTZ that she was on that had her BP controlled prior to the adjustment.   She has an appt with Dr. Ethlyn Gallery on 05/02/18.   She is requesting to go back on the lisinopril and HCTZ until she is seen on 05/02/18.  I have routed a high priority note to Dr. Berenice Bouton nurse pool for further disposition.   Reason for Disposition . Systolic BP  >= 825 OR Diastolic >= 053  Answer Assessment - Initial Assessment Questions 1. BLOOD PRESSURE: "What is the blood pressure?" "Did you take at least two measurements 5 minutes apart?"     176/114 this morning   2. ONSET: "When did you take your blood pressure?"     This morning.   It's been over the weekend too.   I was on Amlodipine and Larsartan.  I called in with headache and tired.   They reduced the medication and now my BP is high.    I was on Lisinopril and HCTZ and my BP was good.   I had a tickle in my throat so she changed my medications around. 3. HOW: "How did you obtain the blood pressure?" (e.g., visiting nurse, automatic home BP monitor)     I have a machine with upper arm cuff. 4. HISTORY: "Do you have a history of high blood pressure?"     Yes 5. MEDICATIONS: "Are you taking any medications for blood pressure?" "Have you missed any doses recently?"     Yes.   See above. 6. OTHER SYMPTOMS: "Do you have any symptoms?" (e.g., headache, chest pain, blurred vision, difficulty breathing, weakness)     I'm having a headache.   I just don't feel good. 7. PREGNANCY: "Is there any chance you are pregnant?" "When was your last menstrual period?"     No.  Had period all weekend.  Protocols used: HIGH BLOOD PRESSURE-A-AH

## 2018-04-25 NOTE — Progress Notes (Signed)
Noted  

## 2018-04-25 NOTE — Progress Notes (Unsigned)
Called patient to review symptoms and medications.    In summary; at first visit 8/7, blood pressure was 180's/100's and she stated that lisinopril-hctz caused slight morning headache that would resolve on own and within a couple of hours.   We transitioned from this medication in hopes of better bp control and decreasing side effects. Amlodipine 5mg  was added on. She complained of fatigue, but wasn't sure if it was stress related and bp had improved. Then we stopped lisinopril-hct and started valsartan. Fatigue seemed worse with this combo and then she started with headache dull lasting all day.   Valsartan was stopped due to belief that it was causing headache, and amlodipine increased to 10mg . BP over weekend back to 170's/100's. She was worried about levels and wants to go back to lisinopril-hctz.   Of note, kidney function improved off lisinopril hctz. Due to her concerns with side effects we will restart lisinipril-hctz but at 40-50 dose. We will plan to see her in 1 weeks time. I have asked her to check bp at home and let us know if not improving. Needs to be seen if any associated symptoms - chest pressure, pain. Would like to discuss other categories of meds with her where we can eliminate side effects and achieve bp control.   Of note; she has stopped previous work; so stress level may be better.   Has to reschedule colonoscopy due to insurance changing. Will wait to put in hematology referral for iron deficiency until she has new insurance.

## 2018-05-02 ENCOUNTER — Encounter: Payer: Self-pay | Admitting: Family Medicine

## 2018-05-02 ENCOUNTER — Ambulatory Visit: Payer: 59 | Admitting: Family Medicine

## 2018-05-02 VITALS — BP 142/82 | HR 101 | Temp 98.2°F | Wt 179.8 lb

## 2018-05-02 DIAGNOSIS — D509 Iron deficiency anemia, unspecified: Secondary | ICD-10-CM

## 2018-05-02 DIAGNOSIS — I10 Essential (primary) hypertension: Secondary | ICD-10-CM

## 2018-05-02 DIAGNOSIS — Z23 Encounter for immunization: Secondary | ICD-10-CM

## 2018-05-02 NOTE — Patient Instructions (Signed)
Continue with the lisinopril-hctz.   Call or message me in 2 weeks time and let me know how blood pressures are looking (low, high and average). Let me know about insurance so we can move forward with referrals.

## 2018-05-02 NOTE — Progress Notes (Signed)
  Jean Bond DOB: 25-Jul-1971 Encounter date: 05/02/2018  This is a 47 y.o. female who presents with Chief Complaint  Patient presents with  . Follow-up    pt states that BP hasimproved since changing back to lisinopril, pt still has a headache first thing in the morning but it goes away, before lisinipril headache lasted all day,     History of present illness: Had difficulty with tolerating the amlodipine (fatigue); and valsartan (worsening headache). When it was attempted to stop the valsartan and increase amlodipine, fatigue worsened and blood pressure spiked.   Taking just 1 of the lisinopril-hctz. Still with morning headache but gone in less than 2 hours.   BP has been much better on this combination. Lowest she has seen at home was 121/82. Takes medication at night. Not waking up at night with a headache.   Was laid off from previous job. Will be on husband's insurance as of today.   Feeling better overall. Reassured that blood pressure has improved.     No Known Allergies Current Meds  Medication Sig  . Blood Pressure Monitoring (BLOOD PRESSURE CUFF) MISC 1 Device by Does not apply route daily.  Marland Kitchen desogestrel-ethinyl estradiol (ISIBLOOM) 0.15-30 MG-MCG tablet Take 1 tablet by mouth daily.  Marland Kitchen lisinopril-hydrochlorothiazide (PRINZIDE,ZESTORETIC) 20-25 MG tablet Take 2 tablets by mouth daily.  . Multiple Vitamin (MULTIVITAMIN) tablet Take 1 tablet by mouth daily.    Review of Systems  Constitutional: Negative for chills, fatigue and fever.  Respiratory: Negative for cough, chest tightness, shortness of breath and wheezing.   Cardiovascular: Negative for chest pain, palpitations and leg swelling.    Objective:  BP (!) 142/82 (BP Location: Left Arm, Patient Position: Sitting, Cuff Size: Normal)   Pulse (!) 101   Temp 98.2 F (36.8 C) (Oral)   Wt 179 lb 12.8 oz (81.6 kg)   SpO2 97%   BMI 29.24 kg/m   Weight: 179 lb 12.8 oz (81.6 kg)   BP Readings from Last  3 Encounters:  05/02/18 (!) 142/82  03/16/18 140/88  03/09/18 (!) 180/118   Wt Readings from Last 3 Encounters:  05/02/18 179 lb 12.8 oz (81.6 kg)  03/16/18 180 lb 14.4 oz (82.1 kg)  03/09/18 180 lb 12.8 oz (82 kg)    Physical Exam  Constitutional: She is oriented to person, place, and time. She appears well-developed and well-nourished. No distress.  Cardiovascular: Normal rate, regular rhythm and normal heart sounds. Exam reveals no friction rub.  No murmur heard. No lower extremity edema  Pulmonary/Chest: Effort normal and breath sounds normal. No respiratory distress. She has no wheezes. She has no rales.  Neurological: She is alert and oriented to person, place, and time.  Psychiatric: She has a normal mood and affect.    Assessment/Plan 1. Iron deficiency anemia, unspecified iron deficiency anemia type Once her new insurance is established; we will refer to hematology for consideration of iron transfusion since levels have decreased with trial of dietary increase and supplement.  2. Essential hypertension Improved from a week ago. She prefers to stay on current medication (lisinopril-hctz) but we discussed that there are other options if the headache side effect becomes a nuisance to her. Plan is to monitor bp for 2 weeks and report back. Further adjustments pending this.     Return pending 2 week update.     Micheline Rough, MD

## 2018-05-05 ENCOUNTER — Encounter: Payer: Self-pay | Admitting: Family Medicine

## 2018-05-12 ENCOUNTER — Encounter: Payer: Self-pay | Admitting: Family Medicine

## 2018-05-16 ENCOUNTER — Other Ambulatory Visit: Payer: 59

## 2018-05-16 ENCOUNTER — Ambulatory Visit: Payer: 59 | Admitting: Family Medicine

## 2018-05-20 ENCOUNTER — Ambulatory Visit: Payer: 59 | Admitting: Family Medicine

## 2018-05-30 ENCOUNTER — Encounter: Payer: Self-pay | Admitting: Family Medicine

## 2018-06-01 ENCOUNTER — Telehealth: Payer: Self-pay

## 2018-06-01 NOTE — Telephone Encounter (Signed)
Jean Macadam, MD  [name], [name] believe patient was waiting for her insurance to kick in; she had recently switched jobs/insurance. Please touch base with her and make sure she has number for them to schedule.

## 2018-06-01 NOTE — Telephone Encounter (Signed)
Spoke with patient, she stated that she had informed Jean Bond that she was unable to schedule an appointment until her insurance started. Patient stated that her insurance is now active and she will call them to schedule an appointment.

## 2018-06-01 NOTE — Telephone Encounter (Signed)
Perfect thanks

## 2018-10-10 ENCOUNTER — Other Ambulatory Visit: Payer: Self-pay | Admitting: Family Medicine

## 2018-10-10 DIAGNOSIS — I10 Essential (primary) hypertension: Secondary | ICD-10-CM

## 2018-12-14 ENCOUNTER — Telehealth: Payer: Self-pay | Admitting: Family Medicine

## 2018-12-14 DIAGNOSIS — I10 Essential (primary) hypertension: Secondary | ICD-10-CM

## 2018-12-14 MED ORDER — LISINOPRIL-HYDROCHLOROTHIAZIDE 20-25 MG PO TABS: 2.0000 | ORAL_TABLET | Freq: Every day | ORAL | 0 refills | Status: DC

## 2018-12-14 NOTE — Telephone Encounter (Signed)
Copied from [place] [phone] [phone]. Topic: Quick Communication - Rx Refill/Question >> Dec 14, 2018  1:23 PM Nils Flack, Marland Kitchen wrote: Medication: lisinopril-hydrochlorothiazide (PRINZIDE,ZESTORETIC) 20-25 MG tablet  Has the patient contacted their pharmacy? Yes.   (Agent: If no, request that the patient contact the pharmacy for the refill.) (Agent: If yes, when and what did the pharmacy advise?) To call office, needed auth Preferred Pharmacy (with phone number or street name): [facility] [address]   Agent: Please be advised that RX refills may take up to 3 business days. We ask that you follow-up with your pharmacy.

## 2018-12-14 NOTE — Telephone Encounter (Signed)
Rx done. 

## 2019-04-06 ENCOUNTER — Telehealth: Payer: Self-pay | Admitting: *Deleted

## 2019-04-06 NOTE — Telephone Encounter (Signed)
Appt scheduled for 9/4 at 2pm.

## 2019-04-06 NOTE — Telephone Encounter (Signed)
Copied from [place] [id number]. Topic: General - Other >> Apr 06, 2019 11:47 AM Ivar Drape wrote: Reason for CRM:  Patient would like to know if she can have Dr. Berenice Bouton 2:00pm appt for tomorrow for a physical.  If not she would like to know the next appt available for a physical.

## 2019-04-07 ENCOUNTER — Ambulatory Visit (INDEPENDENT_AMBULATORY_CARE_PROVIDER_SITE_OTHER): Payer: BC Managed Care – PPO | Admitting: Family Medicine

## 2019-04-07 ENCOUNTER — Other Ambulatory Visit: Payer: Self-pay

## 2019-04-07 ENCOUNTER — Encounter: Payer: Self-pay | Admitting: Family Medicine

## 2019-04-07 VITALS — BP 102/82 | HR 96 | Temp 97.6°F | Ht 65.75 in | Wt 182.1 lb

## 2019-04-07 DIAGNOSIS — Z13220 Encounter for screening for lipoid disorders: Secondary | ICD-10-CM

## 2019-04-07 DIAGNOSIS — D509 Iron deficiency anemia, unspecified: Secondary | ICD-10-CM

## 2019-04-07 DIAGNOSIS — R0683 Snoring: Secondary | ICD-10-CM | POA: Diagnosis not present

## 2019-04-07 DIAGNOSIS — Z0000 Encounter for general adult medical examination without abnormal findings: Secondary | ICD-10-CM

## 2019-04-07 DIAGNOSIS — I10 Essential (primary) hypertension: Secondary | ICD-10-CM

## 2019-04-07 DIAGNOSIS — Z1211 Encounter for screening for malignant neoplasm of colon: Secondary | ICD-10-CM

## 2019-04-07 LAB — COMPREHENSIVE METABOLIC PANEL
ALT: 8 U/L (ref 0–35)
AST: 10 U/L (ref 0–37)
Albumin: 4.2 g/dL (ref 3.5–5.2)
Alkaline Phosphatase: 62 U/L (ref 39–117)
BUN: 10 mg/dL (ref 6–23)
CO2: 26 mEq/L (ref 19–32)
Calcium: 9.7 mg/dL (ref 8.4–10.5)
Chloride: 101 mEq/L (ref 96–112)
Creatinine, Ser: 1.34 mg/dL — ABNORMAL HIGH (ref 0.40–1.20)
GFR: 50.66 mL/min — ABNORMAL LOW (ref >60.00)
Glucose, Bld: 77 mg/dL (ref 70–99)
Potassium: 3.7 mEq/L (ref 3.5–5.1)
Sodium: 137 mEq/L (ref 135–145)
Total Bilirubin: 0.6 mg/dL (ref 0.2–1.2)
Total Protein: 7.7 g/dL (ref 6.0–8.3)

## 2019-04-07 LAB — CBC WITH DIFFERENTIAL/PLATELET
Basophils Absolute: 0.1 10*3/uL (ref 0.0–0.1)
Basophils Relative: 0.7 % (ref 0.0–3.0)
Eosinophils Absolute: 0.2 10*3/uL (ref 0.0–0.7)
Eosinophils Relative: 1.9 % (ref 0.0–5.0)
HCT: 34.8 % — ABNORMAL LOW (ref 36.0–46.0)
Hemoglobin: 11.3 g/dL — ABNORMAL LOW (ref 12.0–15.0)
Lymphocytes Relative: 29 % (ref 12.0–46.0)
Lymphs Abs: 2.7 10*3/uL (ref 0.7–4.0)
MCHC: 32.4 g/dL (ref 30.0–36.0)
MCV: 69 fl — ABNORMAL LOW (ref 78.0–100.0)
Monocytes Absolute: 0.4 10*3/uL (ref 0.1–1.0)
Monocytes Relative: 3.9 % (ref 3.0–12.0)
Neutro Abs: 6 10*3/uL (ref 1.4–7.7)
Neutrophils Relative %: 64.5 % (ref 43.0–77.0)
Platelets: 319 10*3/uL (ref 150.0–400.0)
RBC: 5.05 Mil/uL (ref 3.87–5.11)
RDW: 14.1 % (ref 11.5–15.5)
WBC: 9.4 10*3/uL (ref 4.0–10.5)

## 2019-04-07 LAB — IBC + FERRITIN
Ferritin: 13.8 ng/mL (ref 10.0–291.0)
Iron: 74 ug/dL (ref 42–145)
Saturation Ratios: 16.9 % — ABNORMAL LOW (ref 20.0–50.0)
Transferrin: 313 mg/dL (ref 212.0–360.0)

## 2019-04-07 LAB — LIPID PANEL
Cholesterol: 171 mg/dL (ref 0–200)
HDL: 55.8 mg/dL (ref >39.00)
LDL Cholesterol: 84 mg/dL (ref 0–99)
NonHDL: 114.95
Total CHOL/HDL Ratio: 3
Triglycerides: 155 mg/dL — ABNORMAL HIGH (ref 0.0–149.0)
VLDL: 31 mg/dL (ref 0.0–40.0)

## 2019-04-07 MED ORDER — LISINOPRIL-HYDROCHLOROTHIAZIDE 20-25 MG PO TABS: 1.0000 | ORAL_TABLET | Freq: Every day | ORAL | 1 refills | Status: DC

## 2019-04-07 NOTE — Progress Notes (Signed)
Jean Bond DOB: [birth year] Encounter date: 04/07/2019  This is a 48 y.o. female who presents for complete physical   History of present illness/Additional concerns: HTN: lisinopril hctz 20-25 1 tabs daily. Has headache when she gets up in morning. It is every day. Lasts a few hours. Doesn't take medication for it. Doesn't slow down activity. Takes just 1 tab at bedtime. Has been doing this for a couple of months. Does state that she snores. Husband complains. No apnea witnessed. Sometimes tired during day.   Had constipation with iron supplementation. Tries to do a lot of greens, salads, meat.   Follows with Dr. Ouida Sills for obgyn needs. Periods are "fine". Menstruates 3-4 days. Does have blood clots. Changes q 3 hours. More cramping first day; and then week before can get cramps. Had last mammogram in October/november of last year - does through gynecology.   Past Medical History:  Diagnosis Date  . Headache(784.0)   . History of migraine   . Hypertension    Past Surgical History:  Procedure Laterality Date  . APPENDECTOMY    . [place]    . MYOMECTOMY VAGINAL APPROACH  2008   Allergies  Allergen Reactions  . Amlodipine     fatigue   Current Meds  Medication Sig  . Blood Pressure Monitoring (BLOOD PRESSURE CUFF) MISC 1 Device by Does not apply route daily.  Marland Kitchen desogestrel-ethinyl estradiol (ISIBLOOM) 0.15-30 MG-MCG tablet Take 1 tablet by mouth daily.  Marland Kitchen lisinopril-hydrochlorothiazide (ZESTORETIC) 20-25 MG tablet Take 1 tablet by mouth at bedtime.  . Multiple Vitamin (MULTIVITAMIN) tablet Take 1 tablet by mouth daily.  . [DISCONTINUED] lisinopril-hydrochlorothiazide (ZESTORETIC) 20-25 MG tablet Take 2 tablets by mouth daily.   Social History   Tobacco Use  . Smoking status: Never Smoker  . Smokeless tobacco: Never Used  Substance Use Topics  . Alcohol use: No   Family History  Problem Relation Age of Onset  . Hypertension Father   . Diabetes Father   .  Kidney disease Father   . Healthy Sister   . Cancer Maternal Grandmother        colon   . Thyroid disease Mother   . Breast cancer Mother 73  . Cancer Maternal Grandfather        colon     Review of Systems  Constitutional: Negative for activity change, appetite change, chills, fatigue, fever and unexpected weight change.  HENT: Negative for congestion, ear pain, hearing loss, sinus pressure, sinus pain, sore throat and trouble swallowing.   Eyes: Negative for pain and visual disturbance.  Respiratory: Negative for cough, chest tightness, shortness of breath and wheezing.   Cardiovascular: Negative for chest pain, palpitations and leg swelling.  Gastrointestinal: Negative for abdominal pain, blood in stool, constipation, diarrhea, nausea and vomiting.  Genitourinary: Negative for difficulty urinating and menstrual problem.  Musculoskeletal: Negative for arthralgias and back pain.  Skin: Negative for rash.  Neurological: Positive for headaches. Negative for dizziness, weakness and numbness.  Hematological: Negative for adenopathy. Does not bruise/bleed easily.  Psychiatric/Behavioral: Negative for sleep disturbance (does snore) and suicidal ideas. The patient is not nervous/anxious.     CBC:  Lab Results  Component Value Date   WBC 9.4 04/07/2019   HGB 11.3 (L) 04/07/2019   HCT 34.8 (L) 04/07/2019   MCH 22.7 (L) 09/06/2016   MCHC 32.4 04/07/2019   RDW 14.1 04/07/2019   PLT 319.0 04/07/2019   MPV 10.0 05/06/2013   CMP: Lab Results  Component Value Date  NA 137 04/07/2019   NA 138 05/03/2014   K 3.7 04/07/2019   CL 101 04/07/2019   CO2 26 04/07/2019   ANIONGAP 14 09/06/2016   GLUCOSE 77 04/07/2019   BUN 10 04/07/2019   BUN 15 05/03/2014   CREATININE 1.34 (H) 04/07/2019   CREATININE 1.16 (H) 05/06/2013   GFRAA >60 09/06/2016   CALCIUM 9.7 04/07/2019   PROT 7.7 04/07/2019   BILITOT 0.6 04/07/2019   ALKPHOS 62 04/07/2019   ALT 8 04/07/2019   AST 10 04/07/2019    LIPID: Lab Results  Component Value Date   CHOL 171 04/07/2019   TRIG 155.0 (H) 04/07/2019   HDL 55.80 04/07/2019   LDLCALC 84 04/07/2019    Objective:  BP 102/82 (BP Location: Left Arm, Patient Position: Sitting, Cuff Size: Large)   Pulse 96   Temp 97.6 F (36.4 C) (Temporal)   Ht 5' 5.75" (1.67 m)   Wt 182 lb 1.6 oz (82.6 kg)   LMP 03/31/2019 (Exact Date)   SpO2 98%   BMI 29.62 kg/m   Weight: 182 lb 1.6 oz (82.6 kg)   BP Readings from Last 3 Encounters:  04/07/19 102/82  05/02/18 (!) 142/82  03/16/18 140/88   Wt Readings from Last 3 Encounters:  04/07/19 182 lb 1.6 oz (82.6 kg)  05/02/18 179 lb 12.8 oz (81.6 kg)  03/16/18 180 lb 14.4 oz (82.1 kg)    Physical Exam Constitutional:      General: She is not in acute distress.    Appearance: She is well-developed.  HENT:     Head: Normocephalic and atraumatic.     Right Ear: External ear normal.     Left Ear: External ear normal.     Mouth/Throat:     Pharynx: No oropharyngeal exudate.  Eyes:     Conjunctiva/sclera: Conjunctivae normal.     Pupils: Pupils are equal, round, and reactive to light.  Neck:     Musculoskeletal: Normal range of motion and neck supple.     Thyroid: No thyromegaly.  Cardiovascular:     Rate and Rhythm: Normal rate and regular rhythm.     Heart sounds: Normal heart sounds. No murmur. No friction rub. No gallop.   Pulmonary:     Effort: Pulmonary effort is normal.     Breath sounds: Normal breath sounds.  Abdominal:     General: Bowel sounds are normal. There is no distension.     Palpations: Abdomen is soft. There is no mass.     Tenderness: There is no abdominal tenderness. There is no guarding.     Hernia: No hernia is present.  Musculoskeletal: Normal range of motion.        General: No tenderness or deformity.  Lymphadenopathy:     Cervical: No cervical adenopathy.  Skin:    General: Skin is warm and dry.     Findings: No rash.     Comments: Left side neck- small skin  tag. 27mm dark brown mole also noted. Right side upper back is 0.5cm dark mole. [place]. Per patient has been there years. No noted change in size.   Neurological:     Mental Status: She is alert and oriented to person, place, and time.     Deep Tendon Reflexes: Reflexes normal.     Reflex Scores:      Tricep reflexes are 2+ on the right side and 2+ on the left side.      Bicep reflexes are 2+ on the right side  and 2+ on the left side.      Brachioradialis reflexes are 2+ on the right side and 2+ on the left side.      Patellar reflexes are 2+ on the right side and 2+ on the left side. Psychiatric:        Speech: Speech normal.        Behavior: Behavior normal.        Thought Content: Thought content normal.     Assessment/Plan: Health Maintenance Due  Topic Date Due  . TETANUS/TDAP  04/11/1988  . INFLUENZA VACCINE  03/04/2019   Health Maintenance reviewed.  1. Preventative health care Keep up with exercise and healthy eating.  2. Hypertension, unspecified type Blood pressures well controlled.  Recommend continue current medication.  We discussed alternatives since she does have some headaches.  I am not certain that the headaches are related to medication at this point.  See below. - lisinopril-hydrochlorothiazide (ZESTORETIC) 20-25 MG tablet; Take 1 tablet by mouth at bedtime.  Dispense: 90 tablet; Refill: 1 - CBC with Differential/Platelet; Future - Comprehensive metabolic panel; Future - Comprehensive metabolic panel - CBC with Differential/Platelet  3. Iron deficiency anemia, unspecified iron deficiency anemia type Recheck iron levels. - IBC + Ferritin; Future - IBC + Ferritin  4. Snoring Arranging for sleep study evaluation.  Discussed since she does have a history of snoring there may be some apnea which could be contributing to morning headache.  If sleep study is normal then we can consider switching up blood pressure medications to try and resolve morning headache. -  Ambulatory referral to Sleep Studies; Future  5. Lipid screening - Lipid panel; Future - Lipid panel  6. Screening for colon cancer - Ambulatory referral to Gastroenterology; Future  Return in about 6 months (around 10/05/2019) for pending bloodwork, Chronic condition visit.  Micheline Rough, MD

## 2019-05-23 ENCOUNTER — Other Ambulatory Visit: Payer: Self-pay | Admitting: Registered"

## 2019-05-23 DIAGNOSIS — Z20822 Contact with and (suspected) exposure to covid-19: Secondary | ICD-10-CM

## 2019-05-25 LAB — NOVEL CORONAVIRUS, NAA: SARS-CoV-2, NAA: NOT DETECTED

## 2019-05-30 ENCOUNTER — Other Ambulatory Visit: Payer: Self-pay

## 2019-05-30 DIAGNOSIS — Z20822 Contact with and (suspected) exposure to covid-19: Secondary | ICD-10-CM

## 2019-05-31 LAB — NOVEL CORONAVIRUS, NAA: SARS-CoV-2, NAA: NOT DETECTED

## 2019-06-05 ENCOUNTER — Encounter: Payer: Self-pay | Admitting: Gastroenterology

## 2019-07-03 ENCOUNTER — Ambulatory Visit (AMBULATORY_SURGERY_CENTER): Payer: Self-pay

## 2019-07-03 ENCOUNTER — Other Ambulatory Visit: Payer: Self-pay

## 2019-07-03 ENCOUNTER — Encounter: Payer: Self-pay | Admitting: Gastroenterology

## 2019-07-03 VITALS — Temp 96.6°F | Ht 66.0 in | Wt 190.8 lb

## 2019-07-03 DIAGNOSIS — Z1211 Encounter for screening for malignant neoplasm of colon: Secondary | ICD-10-CM

## 2019-07-03 MED ORDER — NA SULFATE-K SULFATE-MG SULF 17.5-3.13-1.6 GM/177ML PO SOLN: 1.0000 | Freq: Once | ORAL | 0 refills | Status: AC

## 2019-07-03 NOTE — Progress Notes (Signed)
Denies allergies to eggs or soy products. Denies complication of anesthesia or sedation. Denies use of weight loss medication. Denies use of O2.   Emmi instructions given for colonoscopy.  Covid screening is scheduled for Wednesday 07/12/19 @ 2:40 Pm.

## 2019-07-12 ENCOUNTER — Other Ambulatory Visit: Payer: Self-pay | Admitting: Gastroenterology

## 2019-07-12 ENCOUNTER — Ambulatory Visit (INDEPENDENT_AMBULATORY_CARE_PROVIDER_SITE_OTHER): Payer: BC Managed Care – PPO

## 2019-07-12 DIAGNOSIS — Z1159 Encounter for screening for other viral diseases: Secondary | ICD-10-CM

## 2019-07-13 LAB — SARS CORONAVIRUS 2 (TAT 6-24 HRS): SARS Coronavirus 2: NEGATIVE

## 2019-07-17 ENCOUNTER — Encounter: Payer: Self-pay | Admitting: Gastroenterology

## 2019-07-17 ENCOUNTER — Other Ambulatory Visit: Payer: Self-pay

## 2019-07-17 ENCOUNTER — Ambulatory Visit (AMBULATORY_SURGERY_CENTER): Payer: BC Managed Care – PPO | Admitting: Gastroenterology

## 2019-07-17 VITALS — BP 143/86 | HR 78 | Temp 98.3°F | Resp 15 | Ht 66.0 in | Wt 190.8 lb

## 2019-07-17 DIAGNOSIS — Z1211 Encounter for screening for malignant neoplasm of colon: Secondary | ICD-10-CM | POA: Diagnosis present

## 2019-07-17 MED ORDER — SODIUM CHLORIDE 0.9 % IV SOLN: 500.0000 mL | Freq: Once | INTRAVENOUS | Status: DC

## 2019-07-17 NOTE — Progress Notes (Signed)
Report given to PACU, vss 

## 2019-07-17 NOTE — Patient Instructions (Signed)
YOU HAD AN ENDOSCOPIC PROCEDURE TODAY AT [facility]:   Refer to the procedure report that was given to you for any specific questions about what was found during the examination.  If the procedure report does not answer your questions, please call your gastroenterologist to clarify.  If you requested that your care partner not be given the details of your procedure findings, then the procedure report has been included in a sealed envelope for you to review at your convenience later.  ** Handout given on diverticulosis and hemorrhoids**  YOU SHOULD EXPECT: Some feelings of bloating in the abdomen. Passage of more gas than usual.  Walking can help get rid of the air that was put into your GI tract during the procedure and reduce the bloating. If you had a lower endoscopy (such as a colonoscopy or flexible sigmoidoscopy) you may notice spotting of blood in your stool or on the toilet paper. If you underwent a bowel prep for your procedure, you may not have a normal bowel movement for a few days.  Please Note:  You might notice some irritation and congestion in your nose or some drainage.  This is from the oxygen used during your procedure.  There is no need for concern and it should clear up in a day or so.  SYMPTOMS TO REPORT IMMEDIATELY:   Following lower endoscopy (colonoscopy or flexible sigmoidoscopy):  Excessive amounts of blood in the stool  Significant tenderness or worsening of abdominal pains  Swelling of the abdomen that is new, acute  Fever of 100F or higher   For urgent or emergent issues, a gastroenterologist can be reached at any hour by calling [phone].   DIET:  We do recommend a small meal at first, but then you may proceed to your regular diet.  Drink plenty of fluids but you should avoid alcoholic beverages for 24 hours.  ACTIVITY:  You should plan to take it easy for the rest of today and you should NOT DRIVE or use heavy machinery until tomorrow  (because of the sedation medicines used during the test).    FOLLOW UP: Our staff will call the number listed on your records 48-72 hours following your procedure to check on you and address any questions or concerns that you may have regarding the information given to you following your procedure. If we do not reach you, we will leave a message.  We will attempt to reach you two times.  During this call, we will ask if you have developed any symptoms of COVID 19. If you develop any symptoms (ie: fever, flu-like symptoms, shortness of breath, cough etc.) before then, please call [phone].  If you test positive for Covid 19 in the 2 weeks post procedure, please call and report this information to [place].    If any biopsies were taken you will be contacted by phone or by letter within the next 1-3 weeks.  Please call us at [phone] if you have not heard about the biopsies in 3 weeks.    SIGNATURES/CONFIDENTIALITY: You and/or your care partner have signed paperwork which will be entered into your electronic medical record.  These signatures attest to the fact that that the information above on your After Visit Summary has been reviewed and is understood.  Full responsibility of the confidentiality of this discharge information lies with you and/or your care-partner.

## 2019-07-17 NOTE — Progress Notes (Signed)
VS-DT Temp-LC  Pt's states no medical or surgical changes since previsit or office visit.  

## 2019-07-17 NOTE — Op Note (Signed)
[place] Patient Name: Jean Bond Procedure Date: 07/17/2019 9:41 AM MRN: [id number] Endoscopist: Thornton Park MD, MD Age: 48 Referring MD:  Date of Birth: Aug 11, 1970 Gender: Female Account #: [id number] Procedure:                Colonoscopy Indications:              Screening for colorectal malignant neoplasm, This                            is the patient's first colonoscopy Medicines:                Monitored Anesthesia Care Procedure:                Pre-Anesthesia Assessment:                           - Prior to the procedure, a History and Physical                            was performed, and patient medications and                            allergies were reviewed. The patient's tolerance of                            previous anesthesia was also reviewed. The risks                            and benefits of the procedure and the sedation                            options and risks were discussed with the patient.                            All questions were answered, and informed consent                            was obtained. Prior Anticoagulants: The patient has                            taken no previous anticoagulant or antiplatelet                            agents. ASA Grade Assessment: II - A patient with                            mild systemic disease. After reviewing the risks                            and benefits, the patient was deemed in                            satisfactory condition to undergo the procedure.  After obtaining informed consent, the colonoscope                            was passed under direct vision. Throughout the                            procedure, the patient's blood pressure, pulse, and                            oxygen saturations were monitored continuously. The                            Colonoscope was introduced through the anus and                            advanced to the  the cecum, identified by                            appendiceal orifice and ileocecal valve. A second                            forward view of the right colon was performed. The                            colonoscopy was performed without difficulty. The                            patient tolerated the procedure well. The quality                            of the bowel preparation was excellent. The                            ileocecal valve, appendiceal orifice, and rectum                            were photographed. Scope In: 9:58:30 AM Scope Out: 10:13:00 AM Scope Withdrawal Time: 0 hours 11 minutes 9 seconds  Total Procedure Duration: 0 hours 14 minutes 30 seconds  Findings:                 Hemorrhoids were found on perianal exam.                           A few small and large-mouthed diverticula were                            found in the sigmoid colon and descending colon.                           Non-bleeding external and internal hemorrhoids and                            hypertrophied anal papillae were found during  retroflexion.                           The exam was otherwise without abnormality on                            direct and retroflexion views. Complications:            No immediate complications. Estimated Blood Loss:     Estimated blood loss: none. Impression:               - Hemorrhoids found on perianal exam.                           - Diverticulosis in the sigmoid colon and in the                            descending colon.                           - Non-bleeding external and internal hemorrhoids.                           - The examination was otherwise normal on direct                            and retroflexion views.                           - No specimens collected. Recommendation:           - Written discharge instructions were provided to                            the patient.                           - Patient  has a contact number available for                            emergencies. The signs and symptoms of potential                            delayed complications were discussed with the                            patient. Return to normal activities tomorrow.                            Written discharge instructions were provided to the                            patient.                           - High fiber diet.                           -  Continue present medications.                           - Repeat colonoscopy in 10 years for screening                            purposes, earlier with the development of any new                            symptoms. Thornton Park MD, MD 07/17/2019 10:20:13 AM This report has been signed electronically.

## 2019-07-19 ENCOUNTER — Telehealth: Payer: Self-pay

## 2019-07-19 ENCOUNTER — Telehealth: Payer: Self-pay | Admitting: *Deleted

## 2019-07-19 NOTE — Telephone Encounter (Signed)
  Follow up Call-  Call back number 07/17/2019  Post procedure Call Back phone  # [phone]  Permission to leave phone message Yes  Some recent data might be hidden     Patient questions:  Do you have a fever, pain , or abdominal swelling? No. Pain Score  0 *  Have you tolerated food without any problems? Yes.    Have you been able to return to your normal activities? Yes.    Do you have any questions about your discharge instructions: Diet   No. Medications  No. Follow up visit  No.  Do you have questions or concerns about your Care? No.  Actions: * If pain score is 4 or above: No action needed, pain <4.  1. Have you developed a fever since your procedure? no  2.   Have you had an respiratory symptoms (SOB or cough) since your procedure? no  3.   Have you tested positive for COVID 19 since your procedure no  4.   Have you had any family members/close contacts diagnosed with the COVID 19 since your procedure?  no   If yes to any of these questions please route to Joylene John, RN and Alphonsa Gin, Therapist, sports.

## 2019-07-19 NOTE — Telephone Encounter (Signed)
First post procedure follow up call, no answer 

## 2019-10-05 ENCOUNTER — Other Ambulatory Visit: Payer: Self-pay | Admitting: Family Medicine

## 2019-10-05 ENCOUNTER — Other Ambulatory Visit: Payer: Self-pay

## 2019-10-05 DIAGNOSIS — I10 Essential (primary) hypertension: Secondary | ICD-10-CM

## 2019-10-05 NOTE — Progress Notes (Signed)
Jean Bond DOB: March 13, 1971 Encounter date: 10/06/2019  This is a 49 y.o. female who presents with Chief Complaint  Patient presents with  . Hypertension    History of present illness:  Last visit was 6 months ago for complete physical.  We discussed hypertension at that time.  She had difficulty with tolerating medications due to headaches.  We started her on lisinopril/hydrochlorothiazide 20-25 mg daily.  We also arrange for sleep study due to concerns with sleep apnea contributing to morning headache.   Still has headaches, but not noticing them as often. Today does have headache, but also starting menses. When checking at home not getting numbers as high as today; but usually more like 140 (not over) and usually diastolic [id number]. No problems with medication.    Allergies  Allergen Reactions  . Amlodipine     fatigue   Current Meds  Medication Sig  . Blood Pressure Monitoring (BLOOD PRESSURE CUFF) MISC 1 Device by Does not apply route daily.  Marland Kitchen desogestrel-ethinyl estradiol (ISIBLOOM) 0.15-30 MG-MCG tablet Take 1 tablet by mouth daily.  Marland Kitchen lisinopril-hydrochlorothiazide (ZESTORETIC) 20-25 MG tablet Take 2 tablets by mouth at bedtime.  . Multiple Vitamin (MULTIVITAMIN) tablet Take 1 tablet by mouth daily.  . [DISCONTINUED] lisinopril-hydrochlorothiazide (ZESTORETIC) 20-25 MG tablet TAKE 1 TABLET BY MOUTH AT BEDTIME    Review of Systems  Constitutional: Negative for chills, fatigue and fever.  Respiratory: Negative for cough, chest tightness, shortness of breath and wheezing.   Cardiovascular: Negative for chest pain, palpitations and leg swelling.    Objective:  BP (!) 164/100 (BP Location: Left Arm, Patient Position: Sitting, Cuff Size: Large)   Pulse (!) 114   Temp (!) 97.2 F (36.2 C) (Temporal)   Ht 5\' 6"  (1.676 m)   Wt 189 lb 6.4 oz (85.9 kg)   SpO2 97%   BMI 30.57 kg/m   Weight: 189 lb 6.4 oz (85.9 kg)   BP Readings from Last 3 Encounters:  10/06/19  (!) 164/100  07/17/19 (!) 143/86  04/07/19 102/82   Wt Readings from Last 3 Encounters:  10/06/19 189 lb 6.4 oz (85.9 kg)  07/17/19 190 lb 12.8 oz (86.5 kg)  07/03/19 190 lb 12.8 oz (86.5 kg)    Physical Exam Constitutional:      General: She is not in acute distress.    Appearance: She is well-developed.  Cardiovascular:     Rate and Rhythm: Normal rate and regular rhythm.     Heart sounds: Normal heart sounds. No murmur. No friction rub.  Pulmonary:     Effort: Pulmonary effort is normal. No respiratory distress.     Breath sounds: Normal breath sounds. No wheezing or rales.  Musculoskeletal:     Right lower leg: No edema.     Left lower leg: No edema.  Neurological:     Mental Status: She is alert and oriented to person, place, and time.  Psychiatric:        Behavior: Behavior normal.     Assessment/Plan 1. Essential hypertension Increase lisinopril-hctz to 1.5 and then if needed 2 pills daily. She has had difficulty with increase in meds in past so we are going slowly. - CBC with Differential/Platelet; Future - Comprehensive metabolic panel; Future - lisinopril-hydrochlorothiazide (ZESTORETIC) 20-25 MG tablet; Take 2 tablets by mouth at bedtime.  Dispense: 180 tablet; Refill: 1  2. Iron deficiency anemia, unspecified iron deficiency anemia type Recheck levels. - IBC + Ferritin; Future  3. Stage 3a chronic kidney disease - Comprehensive  metabolic panel; Future    Return in about 1 month (around 11/06/2019).     Micheline Rough, MD

## 2019-10-06 ENCOUNTER — Encounter: Payer: Self-pay | Admitting: Family Medicine

## 2019-10-06 ENCOUNTER — Ambulatory Visit (INDEPENDENT_AMBULATORY_CARE_PROVIDER_SITE_OTHER): Payer: BC Managed Care – PPO | Admitting: Family Medicine

## 2019-10-06 ENCOUNTER — Telehealth: Payer: Self-pay | Admitting: Family Medicine

## 2019-10-06 VITALS — BP 172/90 | HR 114 | Temp 97.2°F | Ht 66.0 in | Wt 189.4 lb

## 2019-10-06 DIAGNOSIS — D509 Iron deficiency anemia, unspecified: Secondary | ICD-10-CM | POA: Diagnosis not present

## 2019-10-06 DIAGNOSIS — I10 Essential (primary) hypertension: Secondary | ICD-10-CM | POA: Diagnosis not present

## 2019-10-06 DIAGNOSIS — N1831 Chronic kidney disease, stage 3a: Secondary | ICD-10-CM | POA: Diagnosis not present

## 2019-10-06 DIAGNOSIS — Z23 Encounter for immunization: Secondary | ICD-10-CM

## 2019-10-06 LAB — IBC + FERRITIN
Ferritin: 8.4 ng/mL — ABNORMAL LOW (ref 10.0–291.0)
Iron: 18 ug/dL — ABNORMAL LOW (ref 42–145)
Saturation Ratios: 4.1 % — ABNORMAL LOW (ref 20.0–50.0)
Transferrin: 313 mg/dL (ref 212.0–360.0)

## 2019-10-06 LAB — CBC WITH DIFFERENTIAL/PLATELET
Basophils Absolute: 0.1 10*3/uL (ref 0.0–0.1)
Basophils Relative: 0.8 % (ref 0.0–3.0)
Eosinophils Absolute: 0.3 10*3/uL (ref 0.0–0.7)
Eosinophils Relative: 4.1 % (ref 0.0–5.0)
HCT: 35 % — ABNORMAL LOW (ref 36.0–46.0)
Hemoglobin: 11.4 g/dL — ABNORMAL LOW (ref 12.0–15.0)
Lymphocytes Relative: 30.7 % (ref 12.0–46.0)
Lymphs Abs: 2.5 10*3/uL (ref 0.7–4.0)
MCHC: 32.4 g/dL (ref 30.0–36.0)
MCV: 68.6 fl — ABNORMAL LOW (ref 78.0–100.0)
Monocytes Absolute: 0.3 10*3/uL (ref 0.1–1.0)
Monocytes Relative: 3.4 % (ref 3.0–12.0)
Neutro Abs: 4.9 10*3/uL (ref 1.4–7.7)
Neutrophils Relative %: 61 % (ref 43.0–77.0)
Platelets: 330 10*3/uL (ref 150.0–400.0)
RBC: 5.11 Mil/uL (ref 3.87–5.11)
RDW: 14.8 % (ref 11.5–15.5)
WBC: 8.1 10*3/uL (ref 4.0–10.5)

## 2019-10-06 LAB — COMPREHENSIVE METABOLIC PANEL
ALT: 9 U/L (ref 0–35)
AST: 12 U/L (ref 0–37)
Albumin: 4 g/dL (ref 3.5–5.2)
Alkaline Phosphatase: 61 U/L (ref 39–117)
BUN: 9 mg/dL (ref 6–23)
CO2: 29 mEq/L (ref 19–32)
Calcium: 9.7 mg/dL (ref 8.4–10.5)
Chloride: 102 mEq/L (ref 96–112)
Creatinine, Ser: 1.17 mg/dL (ref 0.40–1.20)
GFR: 59.13 mL/min — ABNORMAL LOW (ref >60.00)
Glucose, Bld: 112 mg/dL — ABNORMAL HIGH (ref 70–99)
Potassium: 3.5 mEq/L (ref 3.5–5.1)
Sodium: 139 mEq/L (ref 135–145)
Total Bilirubin: 0.4 mg/dL (ref 0.2–1.2)
Total Protein: 7.6 g/dL (ref 6.0–8.3)

## 2019-10-06 MED ORDER — LISINOPRIL-HYDROCHLOROTHIAZIDE 20-25 MG PO TABS: 2.0000 | ORAL_TABLET | Freq: Every day | ORAL | 1 refills | Status: DC

## 2019-10-06 NOTE — Patient Instructions (Addendum)
  To schedule sleep study [facility] Pulmonologist in [place], [place] Address: [address], [place], [place] [address] Phone: [phone]   Take 1.5 tablets of the lisinopril-hctz daily x 2 weeks. If pressures are not below [id number] systolic (top number) or 90 (bottom number) regularly, then increase to 2 tablets daily.

## 2019-10-06 NOTE — Addendum Note (Signed)
Addended by: Suzette Battiest on: 10/06/2019 02:46 PM   Modules accepted: Orders

## 2019-10-06 NOTE — Addendum Note (Signed)
Addended by: Agnes Lawrence on: 10/06/2019 02:53 PM   Modules accepted: Orders

## 2019-10-06 NOTE — Telephone Encounter (Signed)
Pharmacy informed pt that the prescription that recently sent in the 180 pill was too to fill because they just filled the prescription for 90. Pt did not pick up the prescription for 90 pills. She would like this worked out with pharmacy.

## 2019-10-08 NOTE — Telephone Encounter (Signed)
I tried to call pharmacy on Friday and was unable to get a hold of them. Please discuss with them. We changed the sig/dosing on her bp medication, so it doesn't make sense that they would not fill this new rx (2 tabs daily and thus 180 pill supply) since she had not yet picked up refill of previous dosing?

## 2019-10-09 NOTE — Telephone Encounter (Signed)
Spoke with the pharmacist Abbie at [facility] and informed her of the message below.  She stated the new Rx will be ready after 12pm today and I called the pt and informed her of this.

## 2019-10-10 ENCOUNTER — Other Ambulatory Visit: Payer: Self-pay

## 2019-10-10 DIAGNOSIS — D509 Iron deficiency anemia, unspecified: Secondary | ICD-10-CM

## 2019-10-11 ENCOUNTER — Telehealth: Payer: Self-pay | Admitting: Hematology and Oncology

## 2019-10-11 NOTE — Telephone Encounter (Signed)
Scheduled per referral. Called and spoke wit pt, confirmed 3/31 appt. Pt aware to arrive 15-30 mins early and to bring insurance card with photo ID

## 2019-10-25 ENCOUNTER — Telehealth (INDEPENDENT_AMBULATORY_CARE_PROVIDER_SITE_OTHER): Payer: BC Managed Care – PPO | Admitting: Family Medicine

## 2019-10-25 ENCOUNTER — Other Ambulatory Visit: Payer: Self-pay

## 2019-10-25 ENCOUNTER — Encounter: Payer: Self-pay | Admitting: Family Medicine

## 2019-10-25 VITALS — Temp 96.7°F | Ht 66.0 in | Wt 189.0 lb

## 2019-10-25 DIAGNOSIS — J039 Acute tonsillitis, unspecified: Secondary | ICD-10-CM | POA: Diagnosis not present

## 2019-10-25 MED ORDER — AMOXICILLIN-POT CLAVULANATE 875-125 MG PO TABS: 1.0000 | ORAL_TABLET | Freq: Two times a day (BID) | ORAL | 0 refills | Status: DC

## 2019-10-25 NOTE — Progress Notes (Signed)
Virtual Visit via Video Note  I connected with the patient on 10/25/19 at 11:15 AM EDT by a video enabled telemedicine application and verified that I am speaking with the correct person using two identifiers.  Location patient: home Location provider:work or home office Persons participating in the virtual visit: patient, provider  I discussed the limitations of evaluation and management by telemedicine and the availability of in person appointments. The patient expressed understanding and agreed to proceed.   HPI: Here for 5 days of a bad ST on the right side, right ear pain, and a dry cough. No fever or headache. No loss of taste or smell. No body aches or NVD. No chest pain or SOB. She is using Theraflu.    ROS: See pertinent positives and negatives per HPI.  Past Medical History:  Diagnosis Date  . Anemia   . Headache(784.0)   . History of migraine   . Hypertension     Past Surgical History:  Procedure Laterality Date  . APPENDECTOMY    . [place]    . MYOMECTOMY VAGINAL APPROACH  2008    Family History  Problem Relation Age of Onset  . Hypertension Father   . Diabetes Father   . Kidney disease Father   . Healthy Sister   . Cancer Maternal Grandmother        colon   . Colon cancer Maternal Grandmother   . Thyroid disease Mother   . Breast cancer Mother 66  . Cancer Maternal Grandfather        colon  . Pancreatic cancer Maternal Uncle   . Esophageal cancer Neg Hx   . Rectal cancer Neg Hx   . Stomach cancer Neg Hx      Current Outpatient Medications:  .  Blood Pressure Monitoring (BLOOD PRESSURE CUFF) MISC, 1 Device by Does not apply route daily., Disp: 1 each, Rfl: 0 .  desogestrel-ethinyl estradiol (ISIBLOOM) 0.15-30 MG-MCG tablet, Take 1 tablet by mouth daily., Disp: , Rfl:  .  lisinopril-hydrochlorothiazide (ZESTORETIC) 20-25 MG tablet, Take 2 tablets by mouth at bedtime., Disp: 180 tablet, Rfl: 1 .  Multiple Vitamin (MULTIVITAMIN) tablet, Take 1 tablet  by mouth daily., Disp: , Rfl:  .  amoxicillin-clavulanate (AUGMENTIN) 875-125 MG tablet, Take 1 tablet by mouth 2 (two) times daily., Disp: 20 tablet, Rfl: 0  EXAM:  VITALS per patient if applicable:  GENERAL: alert, oriented, appears well and in no acute distress  HEENT: atraumatic, conjunttiva clear, no obvious abnormalities on inspection of external nose and ears  NECK: normal movements of the head and neck  LUNGS: on inspection no signs of respiratory distress, breathing rate appears normal, no obvious gross SOB, gasping or wheezing  CV: no obvious cyanosis  MS: moves all visible extremities without noticeable abnormality  PSYCH/NEURO: pleasant and cooperative, no obvious depression or anxiety, speech and thought processing grossly intact  ASSESSMENT AND PLAN: Tonsillitis, treat with Augmentin. Use Ibuprofen as needed.  Alysia Penna, MD  Discussed the following assessment and plan:  No diagnosis found.     I discussed the assessment and treatment plan with the patient. The patient was provided an opportunity to ask questions and all were answered. The patient agreed with the plan and demonstrated an understanding of the instructions.   The patient was advised to call back or seek an in-person evaluation if the symptoms worsen or if the condition fails to improve as anticipated.

## 2019-10-31 ENCOUNTER — Telehealth: Payer: Self-pay | Admitting: Hematology and Oncology

## 2019-10-31 NOTE — Telephone Encounter (Signed)
[name] has been cld and rescheduled to see Dr. Lorenso Courier on 5/5 at 9am w/labs at 10:15am. She's been made aware to arrive 15 minutes early.

## 2019-11-01 ENCOUNTER — Inpatient Hospital Stay: Payer: BC Managed Care – PPO

## 2019-11-01 ENCOUNTER — Inpatient Hospital Stay: Payer: BC Managed Care – PPO | Admitting: Hematology and Oncology

## 2019-11-13 ENCOUNTER — Other Ambulatory Visit: Payer: Self-pay

## 2019-11-13 ENCOUNTER — Encounter: Payer: Self-pay | Admitting: Internal Medicine

## 2019-11-13 ENCOUNTER — Ambulatory Visit (INDEPENDENT_AMBULATORY_CARE_PROVIDER_SITE_OTHER): Payer: 59 | Admitting: Internal Medicine

## 2019-11-13 VITALS — BP 124/80 | HR 103 | Temp 97.9°F | Ht 66.0 in | Wt 188.4 lb

## 2019-11-13 DIAGNOSIS — R0683 Snoring: Secondary | ICD-10-CM | POA: Diagnosis not present

## 2019-11-13 DIAGNOSIS — G4733 Obstructive sleep apnea (adult) (pediatric): Secondary | ICD-10-CM

## 2019-11-13 NOTE — Assessment & Plan Note (Signed)
Not sure how clearly she can associate headaches and snoring with lisinopril use. Discussed significance of snoring and sleep apnea. Plan- sleep study. She might be a candidate for CPAP or an oral appliance

## 2019-11-13 NOTE — Patient Instructions (Signed)
Order- schedule HST dx OSA  Please cal [place] about 2 weks after your sleep study to see if results and recommendations are ready yet. If appropriate, we may be able to start treatment before we see you next.  Please call as needed

## 2019-11-13 NOTE — Progress Notes (Signed)
11/13/19- 33 yoF never smoker for sleep evaluation due to snoring, morning headache. Medical problem list includes HTN, CKD3, Fe def Anemia Body weight today 188 lbs Epworth score 4 Husband reports snoring ENT surgery, cardiopulmonary disease- none No sleep meds and minimal caffeine. Since starting lisinopril a few years ago, she has been waking with frontal HA. Some EDS with tv shows, but denies probs driving.   >hmed Past Medical History:  Diagnosis Date  . Anemia   . Headache(784.0)   . History of migraine   . Hypertension    Past Surgical History:  Procedure Laterality Date  . APPENDECTOMY    . [place]    . MYOMECTOMY VAGINAL APPROACH  2008   Family History  Problem Relation Age of Onset  . Hypertension Father   . Diabetes Father   . Kidney disease Father   . Healthy Sister   . Cancer Maternal Grandmother        colon   . Colon cancer Maternal Grandmother   . Thyroid disease Mother   . Breast cancer Mother 27  . Cancer Maternal Grandfather        colon  . Pancreatic cancer Maternal Uncle   . Esophageal cancer Neg Hx   . Rectal cancer Neg Hx   . Stomach cancer Neg Hx    Social History   Socioeconomic History  . Marital status: Married    Spouse name: Not on file  . Number of children: Not on file  . Years of education: Not on file  . Highest education level: Not on file  Occupational History  . Not on file  Tobacco Use  . Smoking status: Never Smoker  . Smokeless tobacco: Never Used  Substance and Sexual Activity  . Alcohol use: No  . Drug use: No  . Sexual activity: Not on file  Other Topics Concern  . Not on file  Social History Narrative   ** Merged History Encounter **       Social Determinants of Health   Financial Resource Strain:   . Difficulty of Paying Living Expenses:   Food Insecurity:   . Worried About Charity fundraiser in the Last Year:   . Arboriculturist in the Last Year:   Transportation Needs:   . Film/video editor  (Medical):   Marland Kitchen Lack of Transportation (Non-Medical):   Physical Activity:   . Days of Exercise per Week:   . Minutes of Exercise per Session:   Stress:   . Feeling of Stress :   Social Connections:   . Frequency of Communication with Friends and Family:   . Frequency of Social Gatherings with Friends and Family:   . Attends Religious Services:   . Active Member of Clubs or Organizations:   . Attends Archivist Meetings:   Marland Kitchen Marital Status:   Intimate Partner Violence:   . Fear of Current or Ex-Partner:   . Emotionally Abused:   Marland Kitchen Physically Abused:   . Sexually Abused:    ROS-see HPI   + = positive Constitutional:    weight loss, night sweats, fevers, chills, +fatigue, lassitude. HEENT:    +headaches, difficulty swallowing, tooth/dental problems, sore throat,       sneezing, itching, ear ache, nasal congestion, post nasal drip, snoring CV:    chest pain, orthopnea, PND, swelling in lower extremities, anasarca,  dizziness, palpitations Resp:   shortness of breath with exertion or at rest.                productive cough,   non-productive cough, coughing up of blood.              change in color of mucus.  wheezing.   Skin:    rash or lesions. GI:  No-   heartburn, indigestion, abdominal pain, nausea, vomiting, diarrhea,                 change in bowel habits, loss of appetite GU: dysuria, change in color of urine, no urgency or frequency.   flank pain. MS:   joint pain, stiffness, decreased range of motion, back pain. Neuro-     nothing unusual Psych:  change in mood or affect.  depression or anxiety.   memory loss.  Sharee Pimple- Physical Exam General- Alert, Oriented, Affect-appropriate, Distress- none acute Skin- rash-none, lesions- none, excoriation- none Lymphadenopathy- none Head- atraumatic            Eyes- Gross vision intact, PERRLA, conjunctivae and secretions clear            Ears- Hearing, canals-normal            Nose- Clear,  no-Septal dev, mucus, polyps, erosion, perforation             Throat- Mallampati III-IV , mucosa clear , drainage- none, tonsils- atrophic Neck- flexible , trachea midline, no stridor , thyroid nl, carotid no bruit, ? Fullness beside L thyroid= probably just muscle Chest - symmetrical excursion , unlabored           Heart/CV- RRR , no murmur , no gallop  , no rub, nl s1 s2                           - JVD- none , edema- none, stasis changes- none, varices- none           Lung- clear to P&A, wheeze- none, cough- none , dullness-none, rub- none           Chest wall-  Abd-  Br/ Gen/ Rectal- Not done, not indicated Extrem- cyanosis- none, clubbing, none, atrophy- none, strength- nl Neuro- grossly intact to observation

## 2019-11-21 ENCOUNTER — Ambulatory Visit: Payer: BC Managed Care – PPO

## 2019-11-21 ENCOUNTER — Other Ambulatory Visit: Payer: Self-pay

## 2019-11-21 DIAGNOSIS — G4733 Obstructive sleep apnea (adult) (pediatric): Secondary | ICD-10-CM

## 2019-11-21 DIAGNOSIS — R0683 Snoring: Secondary | ICD-10-CM | POA: Diagnosis not present

## 2019-11-22 DIAGNOSIS — R0683 Snoring: Secondary | ICD-10-CM | POA: Diagnosis not present

## 2019-11-24 ENCOUNTER — Ambulatory Visit: Payer: BC Managed Care – PPO | Admitting: Family Medicine

## 2019-12-04 ENCOUNTER — Telehealth: Payer: Self-pay | Admitting: Hematology and Oncology

## 2019-12-04 NOTE — Telephone Encounter (Signed)
Pt cld to reschedule new hem appt w/dr. Lorenso Courier to 5/19 at 9am. She's aware to arrive 15 minutes early.

## 2019-12-06 ENCOUNTER — Encounter: Payer: BC Managed Care – PPO | Admitting: Hematology and Oncology

## 2019-12-06 ENCOUNTER — Other Ambulatory Visit: Payer: BC Managed Care – PPO

## 2019-12-07 ENCOUNTER — Other Ambulatory Visit: Payer: Self-pay

## 2019-12-08 ENCOUNTER — Telehealth: Payer: Self-pay | Admitting: Internal Medicine

## 2019-12-08 ENCOUNTER — Ambulatory Visit (INDEPENDENT_AMBULATORY_CARE_PROVIDER_SITE_OTHER): Payer: 59 | Admitting: Family Medicine

## 2019-12-08 ENCOUNTER — Encounter: Payer: Self-pay | Admitting: Family Medicine

## 2019-12-08 VITALS — BP 122/82 | HR 103 | Temp 97.5°F | Ht 66.0 in | Wt 182.0 lb

## 2019-12-08 DIAGNOSIS — I10 Essential (primary) hypertension: Secondary | ICD-10-CM | POA: Diagnosis not present

## 2019-12-08 DIAGNOSIS — N1831 Chronic kidney disease, stage 3a: Secondary | ICD-10-CM

## 2019-12-08 NOTE — Telephone Encounter (Signed)
Dr Annamaria Boots, please advise on results of HST, thanks

## 2019-12-08 NOTE — Progress Notes (Signed)
  Jean Bond DOB: [birth year] Encounter date: 12/08/2019  This is a 49 y.o. female who presents with Chief Complaint  Patient presents with  . Follow-up    History of present illness: HTN: at last visit discussed increasing lisinopril-hctz to 1.5 and then up to 2 pills/day if needed/tolerated. She tried to take the 1.5 tablet, but had hard time getting half tab. So now working on 1 and at same time. Has been doing more outdoor yard work.   Had to reschedule hematology appointments. She was referred for iron deficiency.   Allergies  Allergen Reactions  . Amlodipine     fatigue   Current Meds  Medication Sig  . Blood Pressure Monitoring (BLOOD PRESSURE CUFF) MISC 1 Device by Does not apply route daily.  Marland Kitchen desogestrel-ethinyl estradiol (ISIBLOOM) 0.15-30 MG-MCG tablet Take 1 tablet by mouth daily.  Marland Kitchen lisinopril-hydrochlorothiazide (ZESTORETIC) 20-25 MG tablet Take 2 tablets by mouth at bedtime.  . Multiple Vitamin (MULTIVITAMIN) tablet Take 1 tablet by mouth daily.    Review of Systems  Constitutional: Negative for chills, fatigue and fever.  Respiratory: Negative for cough, chest tightness, shortness of breath and wheezing.   Cardiovascular: Negative for chest pain, palpitations and leg swelling.    Objective:  BP 122/82 (BP Location: Left Arm, Patient Position: Sitting, Cuff Size: Large)   Pulse (!) 103   Temp (!) 97.5 F (36.4 C) (Temporal)   Ht 5\' 6"  (1.676 m)   Wt 182 lb (82.6 kg)   LMP 12/01/2019   BMI 29.38 kg/m   Weight: 182 lb (82.6 kg)   BP Readings from Last 3 Encounters:  12/08/19 122/82  11/13/19 124/80  10/06/19 (!) 172/90   Wt Readings from Last 3 Encounters:  12/08/19 182 lb (82.6 kg)  11/13/19 188 lb 6.4 oz (85.5 kg)  10/25/19 189 lb (85.7 kg)    Physical Exam Constitutional:      General: She is not in acute distress.    Appearance: She is well-developed.  Cardiovascular:     Rate and Rhythm: Normal rate and regular rhythm.   Heart sounds: Normal heart sounds. No murmur. No friction rub.  Pulmonary:     Effort: Pulmonary effort is normal. No respiratory distress.     Breath sounds: Normal breath sounds. No wheezing or rales.  Musculoskeletal:     Right lower leg: No edema.     Left lower leg: No edema.  Neurological:     Mental Status: She is alert and oriented to person, place, and time.  Psychiatric:        Behavior: Behavior normal.     Assessment/Plan  1. Essential hypertension Stable control and medication is currently well tolerated! Continue with current medications. Continue monitoring at home.   2. Stage 3a chronic kidney disease Has been stable.    Return in about 6 months (around 06/09/2020) for physical exam.    Micheline Rough, MD

## 2019-12-10 NOTE — Telephone Encounter (Signed)
The home sleep test was within normal limits. She averaged only 2 apneas/ hour, with some mild dips in blood oxygen level. Treatment would be limited to observation, trying for normal body weight, and sleep position off back.  We can see her again if needed.  Please send a copy of the sleep study report to her PCP.

## 2019-12-11 NOTE — Telephone Encounter (Signed)
Attempted to call pt but unable to reach. Left message for her to return call. 

## 2019-12-12 NOTE — Telephone Encounter (Signed)
Spoke with pt. She is aware of results. Nothing further was needed.  

## 2019-12-19 ENCOUNTER — Telehealth: Payer: Self-pay | Admitting: Family Medicine

## 2019-12-19 ENCOUNTER — Ambulatory Visit: Payer: 59 | Admitting: Family Medicine

## 2019-12-19 ENCOUNTER — Other Ambulatory Visit: Payer: Self-pay

## 2019-12-19 ENCOUNTER — Encounter: Payer: Self-pay | Admitting: Family Medicine

## 2019-12-19 VITALS — BP 126/78 | HR 98 | Temp 98.0°F | Wt 185.8 lb

## 2019-12-19 DIAGNOSIS — S30860A Insect bite (nonvenomous) of lower back and pelvis, initial encounter: Secondary | ICD-10-CM | POA: Diagnosis not present

## 2019-12-19 DIAGNOSIS — W57XXXA Bitten or stung by nonvenomous insect and other nonvenomous arthropods, initial encounter: Secondary | ICD-10-CM

## 2019-12-19 NOTE — Telephone Encounter (Signed)
I left a message for the pt to return my call as Dr Elease Hashimoto has an opening today in which he agreed to see the pt.  I scheduled the appt and will await the pts call to keep or cancel the appt.

## 2019-12-19 NOTE — Progress Notes (Signed)
  Subjective:     Patient ID: Jean Bond, female   DOB: 1970/08/22, 49 y.o.   MRN: [id number]  HPI   Patient called earlier today with tick bite found this morning in her left groin region.  She had noted something in that region a few days ago and thought this was some type of growth.  She had her husband look at it today and they realized it was a tick.  Her husband applied some topical alcohol and then remove the tick in entirety.  She has had some mild redness and itching.  No fever.  No headaches.  No arthralgias or myalgias. Brings in the tick today and this appears to be a small deer tick, though engorged  Past Medical History:  Diagnosis Date  . Anemia   . Headache(784.0)   . History of migraine   . Hypertension    Past Surgical History:  Procedure Laterality Date  . APPENDECTOMY    . [place]    . MYOMECTOMY VAGINAL APPROACH  2008    reports that she has never smoked. She has never used smokeless tobacco. She reports that she does not drink alcohol or use drugs. family history includes Breast cancer (age of onset: 23) in her mother; Cancer in her maternal grandfather and maternal grandmother; Colon cancer in her maternal grandmother; Diabetes in her father; Healthy in her sister; Hypertension in her father; Kidney disease in her father; Pancreatic cancer in her maternal uncle; Thyroid disease in her mother. Allergies  Allergen Reactions  . Amlodipine     fatigue     Review of Systems  Constitutional: Negative for chills and fever.  Respiratory: Negative for shortness of breath.   Musculoskeletal: Negative for arthralgias and myalgias.  Neurological: Negative for headaches.       Objective:   Physical Exam Vitals reviewed.  Constitutional:      Appearance: Normal appearance.  Cardiovascular:     Rate and Rhythm: Normal rate and regular rhythm.  Skin:    Comments: Small punctate area of rash left groin region.  No pustules.  No vesicles.  Area is only  about 1 to 2 mm  Neurological:     Mental Status: She is alert.        Assessment:     Tick bite left groin from small deer tick.  Minimal local allergic reaction.  She does not have any concerning symptoms at this point for Lyme disease such as headache, fever, arthralgias, etc.    Plan:     -Reviewed signs and symptoms of tick related illness to watch out for -She is aware she will likely have some local itching and local allergic reaction for several days if not weeks  Eulas Post MD [facility] at [facility]

## 2019-12-19 NOTE — Telephone Encounter (Signed)
She found a tick on her this morning by her private part and she was wondering if she needs to come in and she has the tick as well.  Please advise

## 2019-12-19 NOTE — Progress Notes (Signed)
[place] Telephone:([phone]) [phone]   Fax:([phone]) [place] NOTE  Patient Care Team: Caren Macadam, MD as PCP - General (Family Medicine) Olga Millers, MD as Consulting Physician (Obstetrics and Gynecology)  Hematological/Oncological History # Iron Deficiency Anemia 2/2 to GYN Bleeding 1) 02/10/2007: WBC 8.2, Hgb 11.7, MCV 69.9, Plt 324 2) 02/26/2009: WBC 6.5, Hgb 10.9, MCV 71.3, Plt 255 3) 05/06/2013: WBC 8.0, Hgb 11.4, MCV 71.7, Plt not recorded 4) 03/09/2018: WBC 7.8, Hgb 10.7, MCV 68.2, Plt 321 5) 10/06/2019: WBC 8.1, Hgb 11.4, MCV 68.6, Plt 330. Iron 18, Sat 4.1%, ferritin 8.4 6) 12/20/2019: establish care with Dr. Lorenso Courier   CHIEF COMPLAINTS/PURPOSE OF CONSULTATION:  "Iron Deficiency Anemia"  HISTORY OF PRESENTING ILLNESS:  Jean Bond 49 y.o. female with medical history significant for fibroids, HTN, and longstanding anemia who presents for evaluation of iron deficiency anemia.  On review of the previous records Jean Bond has a longstanding history of iron deficiency anemia dating back to at least 02/10/2007.  At that time she was noted to have a hemoglobin 11.7, MCV of 69.9.  From 2008 until 2021 the patient had a hemoglobin with a baseline of approximately 11 and always had a decreased MCV.  She has never had thrombocytosis or any abnormalities with her white blood cell count.  Her most recent iron studies on 10/06/2019 showed iron of 18, saturation of 4.1%, and a ferritin of 8.4.  She had previously been prescribed p.o. iron therapy but self discontinued this approximately 1 month ago due to constipation.  On exam today Jean Bond notes that she feels well and that she has not been having any symptoms consistent with iron deficiency anemia.  She reports that she does have some occasional tiredness, but notes that she is she has been working more outside due to the improvement in the weather.  She notes that she has not  been having issues with shortness of breath, ice cravings, or leg cramps.  She reports that her menstrual cycles have been better than they were earlier in life.  She notes that she does have history of fibroids and is followed by OB/GYN.  Her periods occur at 28-day intervals and last for 4 to 5 days.  On her heaviest days she goes through 4-5 pads which are completely soaked through.  She denies having any other overt signs of bleeding such as nosebleeds, bruising, or dark stools.  The patient was previously trialed on p.o. iron therapy but noted that she had issues with constipation.  She notes that she would not have bowel movements for 2 to 3 days while on the medication and that when she did have a bowel movement there was a good deal of straining.  She notes that she did not try any medications in order to soften her stools.  She is currently taking multivitamin which has a small dose of iron in it.  She has no family history remarkable for hematological disorders.  She is a non-smoker and rarely drinks.  A full 10 point ROS today was otherwise negative.  MEDICAL HISTORY:  Past Medical History:  Diagnosis Date  . Anemia   . Headache(784.0)   . History of migraine   . Hypertension     SURGICAL HISTORY: Past Surgical History:  Procedure Laterality Date  . APPENDECTOMY    . [place]    . MYOMECTOMY VAGINAL APPROACH  2008    SOCIAL HISTORY: Social History   Socioeconomic History  .  Marital status: Married    Spouse name: Not on file  . Number of children: Not on file  . Years of education: Not on file  . Highest education level: Not on file  Occupational History  . Not on file  Tobacco Use  . Smoking status: Never Smoker  . Smokeless tobacco: Never Used  Substance and Sexual Activity  . Alcohol use: No  . Drug use: No  . Sexual activity: Not on file  Other Topics Concern  . Not on file  Social History Narrative   ** Merged History Encounter **       Social  Determinants of Health   Financial Resource Strain:   . Difficulty of Paying Living Expenses:   Food Insecurity:   . Worried About Charity fundraiser in the Last Year:   . Arboriculturist in the Last Year:   Transportation Needs:   . Film/video editor (Medical):   Marland Kitchen Lack of Transportation (Non-Medical):   Physical Activity:   . Days of Exercise per Week:   . Minutes of Exercise per Session:   Stress:   . Feeling of Stress :   Social Connections:   . Frequency of Communication with Friends and Family:   . Frequency of Social Gatherings with Friends and Family:   . Attends Religious Services:   . Active Member of Clubs or Organizations:   . Attends Archivist Meetings:   Marland Kitchen Marital Status:   Intimate Partner Violence:   . Fear of Current or Ex-Partner:   . Emotionally Abused:   Marland Kitchen Physically Abused:   . Sexually Abused:     FAMILY HISTORY: Family History  Problem Relation Age of Onset  . Hypertension Father   . Diabetes Father   . Kidney disease Father   . Healthy Sister   . Cancer Maternal Grandmother        colon   . Colon cancer Maternal Grandmother   . Thyroid disease Mother   . Breast cancer Mother 66  . Cancer Maternal Grandfather        colon  . Pancreatic cancer Maternal Uncle   . Esophageal cancer Neg Hx   . Rectal cancer Neg Hx   . Stomach cancer Neg Hx     ALLERGIES:  is allergic to amlodipine.  MEDICATIONS:  Current Outpatient Medications  Medication Sig Dispense Refill  . Blood Pressure Monitoring (BLOOD PRESSURE CUFF) MISC 1 Device by Does not apply route daily. 1 each 0  . desogestrel-ethinyl estradiol (ISIBLOOM) 0.15-30 MG-MCG tablet Take 1 tablet by mouth daily.    Marland Kitchen lisinopril-hydrochlorothiazide (ZESTORETIC) 20-25 MG tablet Take 2 tablets by mouth at bedtime. 180 tablet 1  . Multiple Vitamin (MULTIVITAMIN) tablet Take 1 tablet by mouth daily.     No current facility-administered medications for this visit.    REVIEW OF  SYSTEMS:   Constitutional: ( - ) fevers, ( - )  chills , ( - ) night sweats Eyes: ( - ) blurriness of vision, ( - ) double vision, ( - ) watery eyes Ears, nose, mouth, throat, and face: ( - ) mucositis, ( - ) sore throat Respiratory: ( - ) cough, ( - ) dyspnea, ( - ) wheezes Cardiovascular: ( - ) palpitation, ( - ) chest discomfort, ( - ) lower extremity swelling Gastrointestinal:  ( - ) nausea, ( - ) heartburn, ( - ) change in bowel habits Skin: ( - ) abnormal skin rashes Lymphatics: ( - )  new lymphadenopathy, ( - ) easy bruising Neurological: ( - ) numbness, ( - ) tingling, ( - ) new weaknesses Behavioral/Psych: ( - ) mood change, ( - ) new changes  All other systems were reviewed with the patient and are negative.  PHYSICAL EXAMINATION: ECOG PERFORMANCE STATUS: 0 - Asymptomatic  Vitals:   12/20/19 0912  BP: (!) 144/89  Pulse: 98  Resp: 17  Temp: (!) 97 F (36.1 C)  SpO2: 100%   Filed Weights   12/20/19 0912  Weight: 185 lb 12.8 oz (84.3 kg)    GENERAL: well appearing middle aged [place] American female in NAD  SKIN: skin color, texture, turgor are normal, no rashes or significant lesions EYES: conjunctiva are pale, sclera clear OROPHARYNX: no exudate, no erythema; lips, buccal mucosa, and tongue normal  LUNGS: clear to auscultation and percussion with normal breathing effort HEART: regular rate & rhythm and no murmurs and no lower extremity edema Musculoskeletal: no cyanosis of digits and no clubbing  PSYCH: alert & oriented x 3, fluent speech NEURO: no focal motor/sensory deficits  LABORATORY DATA:  I have reviewed the data as listed CBC Latest Ref Rng & Units 10/06/2019 04/07/2019 04/22/2018  WBC 4.0 - 10.5 K/uL 8.1 9.4 7.6  Hemoglobin 12.0 - 15.0 g/dL 11.4(L) 11.3(L) 11.3(L)  Hematocrit 36.0 - 46.0 % 35.0(L) 34.8(L) 35.3(L)  Platelets 150.0 - 400.0 K/uL 330.0 319.0 337.0    CMP Latest Ref Rng & Units 10/06/2019 04/07/2019 04/22/2018  Glucose 70 - 99 mg/dL 112(H) 77 90    BUN 6 - 23 mg/dL 9 10 8   Creatinine 0.40 - 1.20 mg/dL 1.17 1.34(H) 1.16  Sodium 135 - 145 mEq/L 139 137 141  Potassium 3.5 - 5.1 mEq/L 3.5 3.7 3.7  Chloride 96 - 112 mEq/L 102 101 106  CO2 19 - 32 mEq/L 29 26 26   Calcium 8.4 - 10.5 mg/dL 9.7 9.7 9.1  Total Protein 6.0 - 8.3 g/dL 7.6 7.7 -  Total Bilirubin 0.2 - 1.2 mg/dL 0.4 0.6 -  Alkaline Phos 39 - 117 U/L 61 62 -  AST 0 - 37 U/L 12 10 -  ALT 0 - 35 U/L 9 8 -   PATHOLOGY: None relevant to review.   BLOOD FILM: Defer in the setting of clear diagnosis of iron deficiency.   RADIOGRAPHIC STUDIES: None relevant to review.  No results found.  ASSESSMENT & PLAN Jean Bond 49 y.o. female with medical history significant for fibroids, HTN, and longstanding anemia who presents for evaluation of iron deficiency anemia.  After review of the labs, review the records, discussion with the patient her findings are most consistent with iron deficiency anemia secondary to GYN bleeding.  The patient has previously been trialed on p.o. oral iron therapy and has not tolerated it due to constipation.  Based on her last the labs from March 2021 the patient is markedly iron deficient with only mild degree of anemia.  Given these findings I would recommend that the patient proceed with IV Feraheme 510 mg q. 7 days x 2 doses in order to correct her iron deficiency.  The patient voiced her understanding of these findings and was agreeable to proceeding with IV iron therapy.  The relative stability of her GYN bleeding I believe at this time that the patient has had low iron stores due to heavier bleeding prior medical history and has never completely replenish her iron stores.  The IV Feraheme will serve to replenish her iron stores and hopefully this  is something she will be able to maintain with dietary iron from that point forward.  We will have the patient follow-up in our clinic in approximately 4 to 6 weeks after her last dose of IV iron in  order to assure that her iron levels are improving and her hemoglobin has corrected.  #Iron Deficiency Anemia 2/2 to GYN Bleeding --findings are most consistent with longstanding iron deficiency from heavy GYN bleeding due to fibroids/heavy menstrual bleeding --patient tried PO iron, but was unable to tolerate due to constipation --recommend starting IV Feraheme 510mg  q7 days x 2 doses. Patient prefers Fridays --if anemia/MCV do not improve can consider hemoglobinopathy assessment.  --patient currently under the care of OB/GYN for her fibroids/GYN bleeding.  --will have patient f/u in clinic 4-6 weeks after he last dose of IV iron   #Tick Bite --defer to PCP for continued management and monitoring for sequelae  Orders Placed This Encounter  Procedures  . CBC with Differential (Cancer Center Only)    Standing Status:   Future    Standing Expiration Date:   12/19/2020  . Retic Panel    Standing Status:   Future    Standing Expiration Date:   12/19/2020  . CMP ([place] only)    Standing Status:   Future    Standing Expiration Date:   12/19/2020  . Iron and TIBC    Standing Status:   Future    Standing Expiration Date:   12/19/2020  . Ferritin    Standing Status:   Future    Standing Expiration Date:   12/19/2020    All questions were answered. The patient knows to call the clinic with any problems, questions or concerns.  A total of more than 45 minutes were spent on this encounter and over half of that time was spent on counseling and coordination of care as outlined above.   Ledell Peoples, MD Department of Hematology/Oncology [place] at [facility] Phone: [phone] Pager: [phone] Email: [email or web address]  12/20/2019 9:58 AM

## 2019-12-19 NOTE — Patient Instructions (Signed)

## 2019-12-19 NOTE — Telephone Encounter (Signed)
Patient is scheduled for 4:30 this afternoon with Dr. Donia Ast

## 2019-12-20 ENCOUNTER — Inpatient Hospital Stay: Payer: 59

## 2019-12-20 ENCOUNTER — Other Ambulatory Visit: Payer: Self-pay

## 2019-12-20 ENCOUNTER — Inpatient Hospital Stay: Payer: 59 | Attending: Hematology and Oncology | Admitting: Hematology and Oncology

## 2019-12-20 ENCOUNTER — Other Ambulatory Visit: Payer: Self-pay | Admitting: Hematology and Oncology

## 2019-12-20 ENCOUNTER — Encounter: Payer: Self-pay | Admitting: Hematology and Oncology

## 2019-12-20 VITALS — BP 144/89 | HR 98 | Temp 97.0°F | Resp 17 | Ht 66.0 in | Wt 185.8 lb

## 2019-12-20 DIAGNOSIS — N920 Excessive and frequent menstruation with regular cycle: Secondary | ICD-10-CM | POA: Diagnosis not present

## 2019-12-20 DIAGNOSIS — D259 Leiomyoma of uterus, unspecified: Secondary | ICD-10-CM | POA: Diagnosis not present

## 2019-12-20 DIAGNOSIS — D500 Iron deficiency anemia secondary to blood loss (chronic): Secondary | ICD-10-CM | POA: Diagnosis not present

## 2019-12-20 LAB — CBC WITH DIFFERENTIAL (CANCER CENTER ONLY)
Abs Immature Granulocytes: 0.03 10*3/uL (ref 0.00–0.07)
Basophils Absolute: 0 10*3/uL (ref 0.0–0.1)
Basophils Relative: 0 %
Eosinophils Absolute: 0.2 10*3/uL (ref 0.0–0.5)
Eosinophils Relative: 2 %
HCT: 32.6 % — ABNORMAL LOW (ref 36.0–46.0)
Hemoglobin: 10.6 g/dL — ABNORMAL LOW (ref 12.0–15.0)
Immature Granulocytes: 0 %
Lymphocytes Relative: 18 %
Lymphs Abs: 1.8 10*3/uL (ref 0.7–4.0)
MCH: 22.2 pg — ABNORMAL LOW (ref 26.0–34.0)
MCHC: 32.5 g/dL (ref 30.0–36.0)
MCV: 68.2 fL — ABNORMAL LOW (ref 80.0–100.0)
Monocytes Absolute: 0.4 10*3/uL (ref 0.1–1.0)
Monocytes Relative: 4 %
Neutro Abs: 7.3 10*3/uL (ref 1.7–7.7)
Neutrophils Relative %: 76 %
Platelet Count: 380 10*3/uL (ref 150–400)
RBC: 4.78 MIL/uL (ref 3.87–5.11)
RDW: 14.2 % (ref 11.5–15.5)
WBC Count: 9.7 10*3/uL (ref 4.0–10.5)
nRBC: 0 % (ref 0.0–0.2)

## 2019-12-20 LAB — IRON AND TIBC
Iron: 63 ug/dL (ref 41–142)
Saturation Ratios: 16 % — ABNORMAL LOW (ref 21–57)
TIBC: 400 ug/dL (ref 236–444)
UIBC: 337 ug/dL (ref 120–384)

## 2019-12-20 LAB — CMP (CANCER CENTER ONLY)
ALT: 10 U/L (ref 0–44)
AST: 15 U/L (ref 15–41)
Albumin: 3.7 g/dL (ref 3.5–5.0)
Alkaline Phosphatase: 57 U/L (ref 38–126)
Anion gap: 11 (ref 5–15)
BUN: 8 mg/dL (ref 6–20)
CO2: 25 mmol/L (ref 22–32)
Calcium: 9.4 mg/dL (ref 8.9–10.3)
Chloride: 104 mmol/L (ref 98–111)
Creatinine: 1.39 mg/dL — ABNORMAL HIGH (ref 0.44–1.00)
GFR, Est AFR Am: 51 mL/min — ABNORMAL LOW (ref >60)
GFR, Estimated: 44 mL/min — ABNORMAL LOW (ref >60)
Glucose, Bld: 101 mg/dL — ABNORMAL HIGH (ref 70–99)
Potassium: 3.3 mmol/L — ABNORMAL LOW (ref 3.5–5.1)
Sodium: 140 mmol/L (ref 135–145)
Total Bilirubin: 0.5 mg/dL (ref 0.3–1.2)
Total Protein: 7.7 g/dL (ref 6.5–8.1)

## 2019-12-20 LAB — RETIC PANEL
Immature Retic Fract: 23 % — ABNORMAL HIGH (ref 2.3–15.9)
RBC.: 4.74 MIL/uL (ref 3.87–5.11)
Retic Count, Absolute: 69.7 10*3/uL (ref 19.0–186.0)
Retic Ct Pct: 1.5 % (ref 0.4–3.1)
Reticulocyte Hemoglobin: 26.7 pg — ABNORMAL LOW (ref >27.9)

## 2019-12-20 LAB — FERRITIN: Ferritin: 8 ng/mL — ABNORMAL LOW (ref 11–307)

## 2019-12-25 ENCOUNTER — Telehealth: Payer: Self-pay | Admitting: Hematology and Oncology

## 2019-12-25 NOTE — Telephone Encounter (Signed)
Scheduled per los. Called and left msg. Mailed printout  °

## 2019-12-25 NOTE — Progress Notes (Signed)
Intravenous Iron Formulation Change  Jean Bond has insurance that requires a change in intravenous iron product from Feraheme to a preferred product, including venofer. Orders have been updated to reflect this change and scheduling message sent to adjust infusion appointments. Dr. Lorenso Courier notified. Drug allergies include amlodipine.   The plan for iron therapy is as follows: Venofer 200mg  IV twice weekly x5 doses.   Demetrius Charity, PharmD, BCPS, [facility] Pharmacist Pharmacy Phone: [phone] 12/25/2019

## 2019-12-26 ENCOUNTER — Telehealth: Payer: Self-pay | Admitting: Hematology and Oncology

## 2019-12-26 NOTE — Telephone Encounter (Signed)
Scheduled appt per 5/24 sch message - pt is aware of appt date and time

## 2019-12-29 ENCOUNTER — Inpatient Hospital Stay: Payer: 59

## 2019-12-29 ENCOUNTER — Other Ambulatory Visit: Payer: Self-pay

## 2019-12-29 VITALS — BP 147/84 | HR 101 | Temp 98.4°F | Resp 18

## 2019-12-29 DIAGNOSIS — D500 Iron deficiency anemia secondary to blood loss (chronic): Secondary | ICD-10-CM | POA: Diagnosis not present

## 2019-12-29 MED ORDER — SODIUM CHLORIDE 0.9 % IV SOLN
Freq: Once | INTRAVENOUS | Status: AC
  Filled 2019-12-29: qty 250

## 2019-12-29 MED ORDER — SODIUM CHLORIDE 0.9 % IV SOLN
200.0000 mg | Freq: Once | INTRAVENOUS | Status: AC
  Administered 2019-12-29: 200 mg via INTRAVENOUS
  Filled 2019-12-29: qty 200

## 2019-12-29 NOTE — Patient Instructions (Signed)

## 2020-01-04 ENCOUNTER — Inpatient Hospital Stay: Payer: 59 | Attending: Hematology and Oncology

## 2020-01-04 ENCOUNTER — Ambulatory Visit: Payer: 59

## 2020-01-04 ENCOUNTER — Other Ambulatory Visit: Payer: Self-pay

## 2020-01-04 VITALS — BP 137/85 | HR 96 | Temp 98.2°F | Resp 20

## 2020-01-04 DIAGNOSIS — D259 Leiomyoma of uterus, unspecified: Secondary | ICD-10-CM | POA: Insufficient documentation

## 2020-01-04 DIAGNOSIS — N920 Excessive and frequent menstruation with regular cycle: Secondary | ICD-10-CM | POA: Insufficient documentation

## 2020-01-04 DIAGNOSIS — D500 Iron deficiency anemia secondary to blood loss (chronic): Secondary | ICD-10-CM | POA: Insufficient documentation

## 2020-01-04 MED ORDER — SODIUM CHLORIDE 0.9 % IV SOLN
Freq: Once | INTRAVENOUS | Status: DC
  Filled 2020-01-04: qty 250

## 2020-01-04 MED ORDER — SODIUM CHLORIDE 0.9 % IV SOLN
200.0000 mg | Freq: Once | INTRAVENOUS | Status: DC
  Filled 2020-01-04: qty 10

## 2020-01-04 NOTE — Progress Notes (Signed)
Pt returning 01/05/20 for Venofer, per Dr. Lorenso Courier.

## 2020-01-04 NOTE — Patient Instructions (Signed)

## 2020-01-05 ENCOUNTER — Inpatient Hospital Stay: Payer: 59

## 2020-01-05 ENCOUNTER — Other Ambulatory Visit: Payer: Self-pay

## 2020-01-05 ENCOUNTER — Ambulatory Visit: Payer: 59

## 2020-01-05 VITALS — BP 122/84 | HR 87 | Temp 98.5°F | Resp 18

## 2020-01-05 DIAGNOSIS — D500 Iron deficiency anemia secondary to blood loss (chronic): Secondary | ICD-10-CM

## 2020-01-05 DIAGNOSIS — D259 Leiomyoma of uterus, unspecified: Secondary | ICD-10-CM | POA: Diagnosis not present

## 2020-01-05 DIAGNOSIS — N920 Excessive and frequent menstruation with regular cycle: Secondary | ICD-10-CM | POA: Diagnosis present

## 2020-01-05 MED ORDER — SODIUM CHLORIDE 0.9 % IV SOLN
200.0000 mg | Freq: Once | INTRAVENOUS | Status: AC
  Administered 2020-01-05: 200 mg via INTRAVENOUS
  Filled 2020-01-05: qty 200

## 2020-01-05 MED ORDER — SODIUM CHLORIDE 0.9 % IV SOLN
Freq: Once | INTRAVENOUS | Status: AC
  Filled 2020-01-05: qty 250

## 2020-01-05 NOTE — Patient Instructions (Signed)

## 2020-01-11 ENCOUNTER — Ambulatory Visit: Payer: 59

## 2020-01-12 ENCOUNTER — Inpatient Hospital Stay: Payer: 59

## 2020-01-12 ENCOUNTER — Other Ambulatory Visit: Payer: Self-pay

## 2020-01-12 VITALS — BP 121/80 | HR 83 | Temp 98.8°F | Resp 18

## 2020-01-12 DIAGNOSIS — D500 Iron deficiency anemia secondary to blood loss (chronic): Secondary | ICD-10-CM

## 2020-01-12 MED ORDER — SODIUM CHLORIDE 0.9 % IV SOLN
200.0000 mg | Freq: Once | INTRAVENOUS | Status: AC
  Administered 2020-01-12: 200 mg via INTRAVENOUS
  Filled 2020-01-12: qty 200

## 2020-01-12 MED ORDER — SODIUM CHLORIDE 0.9 % IV SOLN
Freq: Once | INTRAVENOUS | Status: AC
  Filled 2020-01-12: qty 250

## 2020-01-12 NOTE — Patient Instructions (Signed)

## 2020-01-19 ENCOUNTER — Other Ambulatory Visit: Payer: Self-pay

## 2020-01-19 ENCOUNTER — Inpatient Hospital Stay: Payer: 59

## 2020-01-19 VITALS — BP 122/81 | HR 84 | Temp 98.7°F | Resp 18

## 2020-01-19 DIAGNOSIS — D500 Iron deficiency anemia secondary to blood loss (chronic): Secondary | ICD-10-CM | POA: Diagnosis not present

## 2020-01-19 MED ORDER — SODIUM CHLORIDE 0.9 % IV SOLN
200.0000 mg | Freq: Once | INTRAVENOUS | Status: AC
  Administered 2020-01-19: 200 mg via INTRAVENOUS
  Filled 2020-01-19: qty 10

## 2020-01-19 MED ORDER — SODIUM CHLORIDE 0.9 % IV SOLN
Freq: Once | INTRAVENOUS | Status: AC
  Filled 2020-01-19: qty 250

## 2020-01-19 NOTE — Patient Instructions (Signed)

## 2020-01-26 ENCOUNTER — Inpatient Hospital Stay: Payer: 59

## 2020-01-26 ENCOUNTER — Other Ambulatory Visit: Payer: Self-pay

## 2020-01-26 VITALS — BP 139/83 | HR 93 | Temp 98.3°F | Resp 18

## 2020-01-26 DIAGNOSIS — D500 Iron deficiency anemia secondary to blood loss (chronic): Secondary | ICD-10-CM | POA: Diagnosis not present

## 2020-01-26 MED ORDER — SODIUM CHLORIDE 0.9 % IV SOLN
INTRAVENOUS | Status: DC
  Filled 2020-01-26: qty 250

## 2020-01-26 MED ORDER — SODIUM CHLORIDE 0.9 % IV SOLN
200.0000 mg | Freq: Once | INTRAVENOUS | Status: AC
  Administered 2020-01-26: 200 mg via INTRAVENOUS
  Filled 2020-01-26: qty 200

## 2020-01-26 NOTE — Patient Instructions (Signed)

## 2020-02-09 ENCOUNTER — Other Ambulatory Visit: Payer: 59

## 2020-02-09 ENCOUNTER — Ambulatory Visit: Payer: 59 | Admitting: Hematology and Oncology

## 2020-02-13 ENCOUNTER — Ambulatory Visit: Payer: BC Managed Care – PPO | Admitting: Internal Medicine

## 2020-02-16 ENCOUNTER — Other Ambulatory Visit: Payer: Self-pay

## 2020-02-16 ENCOUNTER — Inpatient Hospital Stay: Payer: 59 | Attending: Hematology and Oncology

## 2020-02-16 ENCOUNTER — Inpatient Hospital Stay: Payer: 59 | Admitting: Hematology and Oncology

## 2020-02-16 ENCOUNTER — Other Ambulatory Visit: Payer: Self-pay | Admitting: Hematology and Oncology

## 2020-02-16 ENCOUNTER — Telehealth: Payer: Self-pay

## 2020-02-16 VITALS — BP 128/70 | HR 83 | Temp 97.7°F | Resp 18 | Ht 66.0 in | Wt 184.2 lb

## 2020-02-16 DIAGNOSIS — Z888 Allergy status to other drugs, medicaments and biological substances status: Secondary | ICD-10-CM | POA: Insufficient documentation

## 2020-02-16 DIAGNOSIS — I10 Essential (primary) hypertension: Secondary | ICD-10-CM | POA: Insufficient documentation

## 2020-02-16 DIAGNOSIS — N920 Excessive and frequent menstruation with regular cycle: Secondary | ICD-10-CM | POA: Diagnosis present

## 2020-02-16 DIAGNOSIS — Z8249 Family history of ischemic heart disease and other diseases of the circulatory system: Secondary | ICD-10-CM | POA: Diagnosis not present

## 2020-02-16 DIAGNOSIS — Z79899 Other long term (current) drug therapy: Secondary | ICD-10-CM | POA: Insufficient documentation

## 2020-02-16 DIAGNOSIS — E875 Hyperkalemia: Secondary | ICD-10-CM | POA: Diagnosis not present

## 2020-02-16 DIAGNOSIS — Z841 Family history of disorders of kidney and ureter: Secondary | ICD-10-CM | POA: Insufficient documentation

## 2020-02-16 DIAGNOSIS — K5900 Constipation, unspecified: Secondary | ICD-10-CM | POA: Insufficient documentation

## 2020-02-16 DIAGNOSIS — D500 Iron deficiency anemia secondary to blood loss (chronic): Secondary | ICD-10-CM

## 2020-02-16 DIAGNOSIS — N1831 Chronic kidney disease, stage 3a: Secondary | ICD-10-CM

## 2020-02-16 DIAGNOSIS — Z9049 Acquired absence of other specified parts of digestive tract: Secondary | ICD-10-CM | POA: Insufficient documentation

## 2020-02-16 DIAGNOSIS — Z8349 Family history of other endocrine, nutritional and metabolic diseases: Secondary | ICD-10-CM | POA: Diagnosis not present

## 2020-02-16 DIAGNOSIS — R718 Other abnormality of red blood cells: Secondary | ICD-10-CM | POA: Diagnosis not present

## 2020-02-16 DIAGNOSIS — Z800 Family history of malignant neoplasm of digestive organs: Secondary | ICD-10-CM | POA: Diagnosis not present

## 2020-02-16 DIAGNOSIS — Z803 Family history of malignant neoplasm of breast: Secondary | ICD-10-CM | POA: Insufficient documentation

## 2020-02-16 LAB — CBC WITH DIFFERENTIAL (CANCER CENTER ONLY)
Abs Immature Granulocytes: 0.04 10*3/uL (ref 0.00–0.07)
Basophils Absolute: 0 10*3/uL (ref 0.0–0.1)
Basophils Relative: 0 %
Eosinophils Absolute: 0.2 10*3/uL (ref 0.0–0.5)
Eosinophils Relative: 3 %
HCT: 33.4 % — ABNORMAL LOW (ref 36.0–46.0)
Hemoglobin: 11 g/dL — ABNORMAL LOW (ref 12.0–15.0)
Immature Granulocytes: 1 %
Lymphocytes Relative: 26 %
Lymphs Abs: 2 10*3/uL (ref 0.7–4.0)
MCH: 22.4 pg — ABNORMAL LOW (ref 26.0–34.0)
MCHC: 32.9 g/dL (ref 30.0–36.0)
MCV: 68.2 fL — ABNORMAL LOW (ref 80.0–100.0)
Monocytes Absolute: 0.3 10*3/uL (ref 0.1–1.0)
Monocytes Relative: 5 %
Neutro Abs: 4.9 10*3/uL (ref 1.7–7.7)
Neutrophils Relative %: 65 %
Platelet Count: 332 10*3/uL (ref 150–400)
RBC: 4.9 MIL/uL (ref 3.87–5.11)
RDW: 14.4 % (ref 11.5–15.5)
WBC Count: 7.5 10*3/uL (ref 4.0–10.5)
nRBC: 0 % (ref 0.0–0.2)

## 2020-02-16 LAB — IRON AND TIBC
Iron: 69 ug/dL (ref 41–142)
Saturation Ratios: 23 % (ref 21–57)
TIBC: 298 ug/dL (ref 236–444)
UIBC: 229 ug/dL (ref 120–384)

## 2020-02-16 LAB — CMP (CANCER CENTER ONLY)
ALT: 15 U/L (ref 0–44)
AST: 16 U/L (ref 15–41)
Albumin: 3.6 g/dL (ref 3.5–5.0)
Alkaline Phosphatase: 55 U/L (ref 38–126)
Anion gap: 10 (ref 5–15)
BUN: 11 mg/dL (ref 6–20)
CO2: 22 mmol/L (ref 22–32)
Calcium: 9.1 mg/dL (ref 8.9–10.3)
Chloride: 105 mmol/L (ref 98–111)
Creatinine: 1.17 mg/dL — ABNORMAL HIGH (ref 0.44–1.00)
GFR, Est AFR Am: >60 mL/min (ref >60)
GFR, Estimated: 54 mL/min — ABNORMAL LOW (ref >60)
Glucose, Bld: 93 mg/dL (ref 70–99)
Potassium: 2.9 mmol/L — CL (ref 3.5–5.1)
Sodium: 137 mmol/L (ref 135–145)
Total Bilirubin: 0.4 mg/dL (ref 0.3–1.2)
Total Protein: 7.6 g/dL (ref 6.5–8.1)

## 2020-02-16 LAB — FERRITIN: Ferritin: 238 ng/mL (ref 11–307)

## 2020-02-16 LAB — RETIC PANEL
Immature Retic Fract: 13.1 % (ref 2.3–15.9)
RBC.: 4.84 MIL/uL (ref 3.87–5.11)
Retic Count, Absolute: 70.7 10*3/uL (ref 19.0–186.0)
Retic Ct Pct: 1.5 % (ref 0.4–3.1)
Reticulocyte Hemoglobin: 28.9 pg (ref >27.9)

## 2020-02-16 LAB — SAVE SMEAR(SSMR), FOR PROVIDER SLIDE REVIEW

## 2020-02-16 LAB — LACTATE DEHYDROGENASE: LDH: 186 U/L (ref 98–192)

## 2020-02-16 MED ORDER — POTASSIUM CHLORIDE CRYS ER 20 MEQ PO TBCR
20.0000 meq | EXTENDED_RELEASE_TABLET | Freq: Two times a day (BID) | ORAL | 0 refills | Status: DC
Start: 2020-02-16 — End: 2020-02-23

## 2020-02-16 NOTE — Progress Notes (Signed)
[place] [phone]   [phone]  PROGRESS NOTE  Patient Care Team: Caren Macadam, MD as PCP - General (Family Medicine) Olga Millers, MD as Consulting Physician (Obstetrics and Gynecology)  Hematological/Oncological History # Iron Deficiency Anemia 2/2 to GYN Bleeding 1) 02/10/2007: WBC 8.2, Hgb 11.7, MCV 69.9, Plt 324 2) 02/26/2009: WBC 6.5, Hgb 10.9, MCV 71.3, Plt 255 3) 05/06/2013: WBC 8.0, Hgb 11.4, MCV 71.7, Plt not recorded 4) 03/09/2018: WBC 7.8, Hgb 10.7, MCV 68.2, Plt 321 5) 10/06/2019: WBC 8.1, Hgb 11.4, MCV 68.6, Plt 330. Iron 18, Sat 4.1%, ferritin 8.4 6) 12/20/2019: establish care with Dr. Lorenso Courier   Interval History:  Jean Bond 49 y.o. female with medical history significant for iron deficiency anemia 2/2 to GYN blood loss who presents for a follow up visit. The patient's last visit was on 12/20/2019 at which time she established care. In the interim since the last visit she has received x 5 doses of iron sucrose IV from 12/29/2019 to 01/26/2020.   On exam today Jean Bond notes that she has had about the same level of energy and nothing has changed since we started p.o. iron therapy.  She notes that she has been having some constipation issues since that time, but prune juice has been helping with this constipation.  She reports that she did have a menstrual cycle in the interim which was normal length of time approximately 5 days and consumed about 3-5 pads per day.  She notes that she has been eating greens and increased her intake of hamburgers in order to increase her iron consumption.  She notes that she has not been having any issues with fevers, chills, sweats, nausea, vomiting or diarrhea.  A full 10 point ROS is listed below.  MEDICAL HISTORY:  Past Medical History:  Diagnosis Date  . Anemia   . Headache(784.0)   . History of migraine   . Hypertension     SURGICAL HISTORY: Past Surgical History:    Procedure Laterality Date  . APPENDECTOMY    . [place]    . MYOMECTOMY VAGINAL APPROACH  2008    SOCIAL HISTORY: Social History   Socioeconomic History  . Marital status: Married    Spouse name: Not on file  . Number of children: Not on file  . Years of education: Not on file  . Highest education level: Not on file  Occupational History  . Not on file  Tobacco Use  . Smoking status: Never Smoker  . Smokeless tobacco: Never Used  Vaping Use  . Vaping Use: Never used  Substance and Sexual Activity  . Alcohol use: No  . Drug use: No  . Sexual activity: Not on file  Other Topics Concern  . Not on file  Social History Narrative   ** Merged History Encounter **       Social Determinants of Health   Financial Resource Strain:   . Difficulty of Paying Living Expenses:   Food Insecurity:   . Worried About Charity fundraiser in the Last Year:   . Arboriculturist in the Last Year:   Transportation Needs:   . Film/video editor (Medical):   Marland Kitchen Lack of Transportation (Non-Medical):   Physical Activity:   . Days of Exercise per Week:   . Minutes of Exercise per Session:   Stress:   . Feeling of Stress :   Social Connections:   . Frequency of Communication with Friends  and Family:   . Frequency of Social Gatherings with Friends and Family:   . Attends Religious Services:   . Active Member of Clubs or Organizations:   . Attends Archivist Meetings:   Marland Kitchen Marital Status:   Intimate Partner Violence:   . Fear of Current or Ex-Partner:   . Emotionally Abused:   Marland Kitchen Physically Abused:   . Sexually Abused:     FAMILY HISTORY: Family History  Problem Relation Age of Onset  . Hypertension Father   . Diabetes Father   . Kidney disease Father   . Healthy Sister   . Cancer Maternal Grandmother        colon   . Colon cancer Maternal Grandmother   . Thyroid disease Mother   . Breast cancer Mother 81  . Cancer Maternal Grandfather        colon  .  Pancreatic cancer Maternal Uncle   . Esophageal cancer Neg Hx   . Rectal cancer Neg Hx   . Stomach cancer Neg Hx     ALLERGIES:  is allergic to amlodipine.  MEDICATIONS:  Current Outpatient Medications  Medication Sig Dispense Refill  . Blood Pressure Monitoring (BLOOD PRESSURE CUFF) MISC 1 Device by Does not apply route daily. 1 each 0  . desogestrel-ethinyl estradiol (ISIBLOOM) 0.15-30 MG-MCG tablet Take 1 tablet by mouth daily.    Marland Kitchen lisinopril-hydrochlorothiazide (ZESTORETIC) 20-25 MG tablet Take 2 tablets by mouth at bedtime. 180 tablet 1  . Multiple Vitamin (MULTIVITAMIN) tablet Take 1 tablet by mouth daily.    . potassium chloride SA (KLOR-CON) 20 MEQ tablet Take 1 tablet (20 mEq total) by mouth 2 (two) times daily. 15 tablet 0   No current facility-administered medications for this visit.    REVIEW OF SYSTEMS:   Constitutional: ( - ) fevers, ( - )  chills , ( - ) night sweats Eyes: ( - ) blurriness of vision, ( - ) double vision, ( - ) watery eyes Ears, nose, mouth, throat, and face: ( - ) mucositis, ( - ) sore throat Respiratory: ( - ) cough, ( - ) dyspnea, ( - ) wheezes Cardiovascular: ( - ) palpitation, ( - ) chest discomfort, ( - ) lower extremity swelling Gastrointestinal:  ( - ) nausea, ( - ) heartburn, ( - ) change in bowel habits Skin: ( - ) abnormal skin rashes Lymphatics: ( - ) new lymphadenopathy, ( - ) easy bruising Neurological: ( - ) numbness, ( - ) tingling, ( - ) new weaknesses Behavioral/Psych: ( - ) mood change, ( - ) new changes  All other systems were reviewed with the patient and are negative.  PHYSICAL EXAMINATION: ECOG PERFORMANCE STATUS: 1 - Symptomatic but completely ambulatory  Vitals:   02/16/20 1126  BP: 128/70  Pulse: 83  Resp: 18  Temp: 97.7 F (36.5 C)  SpO2: 100%   Filed Weights   02/16/20 1126  Weight: 184 lb 3.2 oz (83.6 kg)    GENERAL: well appearing middle aged [place] American female, alert, no distress and  comfortable SKIN: skin color, texture, turgor are normal, no rashes or significant lesions EYES: conjunctiva are pink and non-injected, sclera clear LUNGS: clear to auscultation and percussion with normal breathing effort HEART: regular rate & rhythm and no murmurs and no lower extremity edema Musculoskeletal: no cyanosis of digits and no clubbing  PSYCH: alert & oriented x 3, fluent speech NEURO: no focal motor/sensory deficits  LABORATORY DATA:  I have reviewed the data as  listed CBC Latest Ref Rng & Units 02/16/2020 12/20/2019 10/06/2019  WBC 4.0 - 10.5 K/uL 7.5 9.7 8.1  Hemoglobin 12.0 - 15.0 g/dL 11.0(L) 10.6(L) 11.4(L)  Hematocrit 36 - 46 % 33.4(L) 32.6(L) 35.0(L)  Platelets 150 - 400 K/uL 332 380 330.0    CMP Latest Ref Rng & Units 02/16/2020 12/20/2019 10/06/2019  Glucose 70 - 99 mg/dL 93 101(H) 112(H)  BUN 6 - 20 mg/dL 11 8 9   Creatinine 0.44 - 1.00 mg/dL 1.17(H) 1.39(H) 1.17  Sodium 135 - 145 mmol/L 137 140 139  Potassium 3.5 - 5.1 mmol/L 2.9(LL) 3.3(L) 3.5  Chloride 98 - 111 mmol/L 105 104 102  CO2 22 - 32 mmol/L 22 25 29   Calcium 8.9 - 10.3 mg/dL 9.1 9.4 9.7  Total Protein 6.5 - 8.1 g/dL 7.6 7.7 7.6  Total Bilirubin 0.3 - 1.2 mg/dL 0.4 0.5 0.4  Alkaline Phos 38 - 126 U/L 55 57 61  AST 15 - 41 U/L 16 15 12   ALT 0 - 44 U/L 15 10 9     RADIOGRAPHIC STUDIES: I have personally reviewed the radiological images as listed and agreed with the findings in the report. No results found.  ASSESSMENT & PLAN Jean Bond 49 y.o. female with medical history significant for iron deficiency anemia 2/2 to GYN blood loss who presents for a follow up visit.  After review the labs and discussion with the patient her findings are most consistent with a persistent microcytic anemia despite administration of 5 doses of IV iron sucrose 200 mg.  I am concerned that the patient either has 1) continued severe blood loss or 2) a hemoglobinopathy.  The differentiation between these 2 would be  if the patient has adequately repleted iron stores.  After assessing the labs today the patient was found to have replete iron, implying that there is a different etiology for her microcytosis and anemia other than iron deficiency.  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  I am also concerned that the patient was found to have a low potassium today for which I would recommend potassium supplementation with 20 mEq of potassium a day with holding of her hydrochlorothiazide temporarily.  I will reach out to her PCP to assure that she is aware we have made this temporary drug change.  # Iron Deficiency Anemia 2/2 to GYN Bleeding --patient has completed 5 doses of IV sucrose with no improvement in symptoms or Hgb --Iron level returned today showing that her iron stores are adequately repleted. This would imply that she may have a hemoglobinopathy as the cause of her blood findings --recommend return to our lab for Hgb Eletrophoresis and additional labs to include erythropoietin to assure no alternative cause of her findings.  --RTC pending results of the above testing. If a thalassemia is noted there is no further intervention required in our clinic.   #Hyperkalemia --K found to be 2.9 today, 3.3 at last visit. --recommend patient hold her HCTZ and take 40meq K daily --defer restarting BP medication to PCP.   No orders of the defined types were placed in this encounter.   All questions were answered. The patient knows to call the clinic with any problems, questions or concerns.  A total of more than 30 minutes were spent on this encounter and over half of that time was spent on counseling and coordination of care as outlined above.   Ledell Peoples, MD Department  of Hematology/Oncology [place] [facility] Phone: [phone] Pager: [phone] Email:  [email or web address]  02/18/2020 4:32 PM

## 2020-02-16 NOTE — Telephone Encounter (Signed)
CRITICAL VALUE STICKER  CRITICAL VALUE: Potassium 2.9  RECEIVER (on-site recipient of call): Jobe Igo, [place] NOTIFIED:  02/16/2020 @ 1151  MESSENGER (representative from lab): Pam  MD NOTIFIED: Notified Joesphine Bare, MD desk RN  TIME OF NOTIFICATION: [id number]  RESPONSE: Beth T to address with MD

## 2020-02-18 ENCOUNTER — Encounter: Payer: Self-pay | Admitting: Hematology and Oncology

## 2020-02-19 ENCOUNTER — Telehealth: Payer: Self-pay | Admitting: *Deleted

## 2020-02-19 NOTE — Telephone Encounter (Signed)
-----   Message from Orson Slick, MD sent at 02/18/2020  4:30 PM EDT ----- Please let Mrs. Tobe Sos know that her bloodwork shows her iron levels have rebounded appropriately. I am concerned that this means there is another cause for her microcytic anemia. Please ask her to return to our lab for a few additional tests to help make the diagnosis.  Will order Hgb Electrophoresis and erythropoietin. Additionally will collect repeat BMP to assure K is improving.  ----- Message ----- From: Buel Ream, Lab In [place] Sent: 02/16/2020  11:30 AM EDT To: Orson Slick, MD

## 2020-02-19 NOTE — Telephone Encounter (Signed)
TCT patient. No answer but was able to leave vm message for pt regarding recent lab results. Advised that Dr. Lorenso Courier would like to do a couple of more lab tests related to her anemia. Advised that scheduling would call her to set up this appt. Advised to call back to [phone] if any questions.  Scheduling message sent.

## 2020-02-20 ENCOUNTER — Telehealth: Payer: Self-pay | Admitting: Hematology and Oncology

## 2020-02-20 ENCOUNTER — Telehealth: Payer: Self-pay | Admitting: *Deleted

## 2020-02-20 NOTE — Telephone Encounter (Signed)
-----   Message from Lucretia Kern, DO sent at 02/20/2020  9:57 AM EDT ----- Doesn't look like patient has seen PCP in some time. Advise follow up appt with PCP office in next 1-2 weeks and can recheck at visit. Advise monitoring home BP if possible and call if running high. Thanks.  ----- Message ----- From: Orson Slick, MD Sent: 02/18/2020   4:27 PM EDT To: Caren Macadam, MD, Lucretia Kern, DO  Dr. Ethlyn Gallery,  I wanted to make you aware that we saw your patient Mrs. Tobe Sos in clinic for iron deficiecy anemia. Our bloodwork showed a K of 2.9. We recommended holding HCTZ and start K supplementation PO. I will defer to you for when to restart and recheck K.  Best,  Dr. Lorenso Courier

## 2020-02-20 NOTE — Telephone Encounter (Signed)
Scheduled apt per 7/19 sch msg - unable to reach pt. Left message with appt date and time with call back number

## 2020-02-20 NOTE — Telephone Encounter (Signed)
Spoke with the pt and informed her of the message below.  Patient stated she did not stop taking HCTZ as "she knows how her blood pressure is" and has still been taking this every other day.  Mychart video appt scheduled for 8/6 and message sent to PCP.

## 2020-02-21 NOTE — Telephone Encounter (Signed)
Please order and see if she can repeat bmp before end of week. I want to make sure she is improving with potassium. She also is on a combination bp medication; so if potassium is still low and we have to rethink/stop the hctz we will have to send in new rx. Please make sure feeling ok and that bp are running ok. Thanks!

## 2020-02-21 NOTE — Telephone Encounter (Signed)
Left a message for the pt to return my call.  

## 2020-02-22 ENCOUNTER — Other Ambulatory Visit: Payer: Self-pay | Admitting: Hematology and Oncology

## 2020-02-22 NOTE — Telephone Encounter (Signed)
Noted; she should be able to complete recheck there

## 2020-02-22 NOTE — Telephone Encounter (Signed)
Patient called back and was informed of the message below.  Patient declined lab appt here as she has a lab appt with Dr Lorenso Courier tomorrow.  Message sent to PCP.

## 2020-02-23 ENCOUNTER — Other Ambulatory Visit: Payer: Self-pay

## 2020-02-23 ENCOUNTER — Other Ambulatory Visit: Payer: Self-pay | Admitting: Hematology and Oncology

## 2020-02-23 ENCOUNTER — Inpatient Hospital Stay: Payer: 59

## 2020-02-23 DIAGNOSIS — D500 Iron deficiency anemia secondary to blood loss (chronic): Secondary | ICD-10-CM

## 2020-02-23 LAB — CBC WITH DIFFERENTIAL (CANCER CENTER ONLY)
Abs Immature Granulocytes: 0.11 10*3/uL — ABNORMAL HIGH (ref 0.00–0.07)
Basophils Absolute: 0.1 10*3/uL (ref 0.0–0.1)
Basophils Relative: 1 %
Eosinophils Absolute: 0.2 10*3/uL (ref 0.0–0.5)
Eosinophils Relative: 2 %
HCT: 36.4 % (ref 36.0–46.0)
Hemoglobin: 11.8 g/dL — ABNORMAL LOW (ref 12.0–15.0)
Immature Granulocytes: 1 %
Lymphocytes Relative: 27 %
Lymphs Abs: 2.2 10*3/uL (ref 0.7–4.0)
MCH: 22.6 pg — ABNORMAL LOW (ref 26.0–34.0)
MCHC: 32.4 g/dL (ref 30.0–36.0)
MCV: 69.7 fL — ABNORMAL LOW (ref 80.0–100.0)
Monocytes Absolute: 0.3 10*3/uL (ref 0.1–1.0)
Monocytes Relative: 4 %
Neutro Abs: 5.2 10*3/uL (ref 1.7–7.7)
Neutrophils Relative %: 65 %
Platelet Count: 281 10*3/uL (ref 150–400)
RBC: 5.22 MIL/uL — ABNORMAL HIGH (ref 3.87–5.11)
RDW: 14.2 % (ref 11.5–15.5)
WBC Count: 8 10*3/uL (ref 4.0–10.5)
nRBC: 0 % (ref 0.0–0.2)

## 2020-02-23 LAB — CMP (CANCER CENTER ONLY)
ALT: 12 U/L (ref 0–44)
AST: 19 U/L (ref 15–41)
Albumin: 4 g/dL (ref 3.5–5.0)
Alkaline Phosphatase: 62 U/L (ref 38–126)
Anion gap: 14 (ref 5–15)
BUN: 7 mg/dL (ref 6–20)
CO2: 21 mmol/L — ABNORMAL LOW (ref 22–32)
Calcium: 9.7 mg/dL (ref 8.9–10.3)
Chloride: 105 mmol/L (ref 98–111)
Creatinine: 1.21 mg/dL — ABNORMAL HIGH (ref 0.44–1.00)
GFR, Est AFR Am: >60 mL/min (ref >60)
GFR, Estimated: 52 mL/min — ABNORMAL LOW (ref >60)
Glucose, Bld: 87 mg/dL (ref 70–99)
Potassium: 3.9 mmol/L (ref 3.5–5.1)
Sodium: 140 mmol/L (ref 135–145)
Total Bilirubin: 0.4 mg/dL (ref 0.3–1.2)
Total Protein: 8.2 g/dL — ABNORMAL HIGH (ref 6.5–8.1)

## 2020-02-28 LAB — HGB FRACTIONATION BY HPLC
Hgb A: 67.4 % — ABNORMAL LOW (ref 96.4–98.8)
Hgb C: 29.2 % — ABNORMAL HIGH
Hgb E: 0 %
Hgb F: 0 % (ref 0.0–2.0)
Hgb S: 0 %
Hgb Variant: 0 %

## 2020-02-28 LAB — HGB FRACTIONATION CASCADE: Hgb A2: 3.4 % — ABNORMAL HIGH (ref 1.8–3.2)

## 2020-02-29 ENCOUNTER — Ambulatory Visit: Payer: 59 | Admitting: Internal Medicine

## 2020-02-29 ENCOUNTER — Other Ambulatory Visit: Payer: Self-pay | Admitting: Hematology and Oncology

## 2020-03-04 ENCOUNTER — Other Ambulatory Visit: Payer: Self-pay | Admitting: Hematology and Oncology

## 2020-03-08 ENCOUNTER — Telehealth: Payer: 59 | Admitting: Family Medicine

## 2020-03-08 ENCOUNTER — Other Ambulatory Visit: Payer: Self-pay | Admitting: Hematology and Oncology

## 2020-03-12 ENCOUNTER — Other Ambulatory Visit: Payer: Self-pay | Admitting: Hematology and Oncology

## 2020-04-12 ENCOUNTER — Ambulatory Visit (INDEPENDENT_AMBULATORY_CARE_PROVIDER_SITE_OTHER): Payer: 59 | Admitting: Family Medicine

## 2020-04-12 ENCOUNTER — Encounter: Payer: Self-pay | Admitting: Family Medicine

## 2020-04-12 ENCOUNTER — Other Ambulatory Visit: Payer: Self-pay

## 2020-04-12 VITALS — BP 108/78 | HR 86 | Temp 98.2°F | Ht 66.0 in | Wt 183.8 lb

## 2020-04-12 DIAGNOSIS — I10 Essential (primary) hypertension: Secondary | ICD-10-CM | POA: Diagnosis not present

## 2020-04-12 DIAGNOSIS — D509 Iron deficiency anemia, unspecified: Secondary | ICD-10-CM

## 2020-04-12 DIAGNOSIS — Z0000 Encounter for general adult medical examination without abnormal findings: Secondary | ICD-10-CM

## 2020-04-12 DIAGNOSIS — Z23 Encounter for immunization: Secondary | ICD-10-CM

## 2020-04-12 DIAGNOSIS — E876 Hypokalemia: Secondary | ICD-10-CM

## 2020-04-12 MED ORDER — LISINOPRIL-HYDROCHLOROTHIAZIDE 20-25 MG PO TABS: 1.0000 | ORAL_TABLET | Freq: Every day | ORAL | 1 refills | Status: DC

## 2020-04-12 NOTE — Progress Notes (Signed)
Jean Bond DOB: 03-24-1971 Encounter date: 04/12/2020  This is a 49 y.o. female who presents for complete physical   History of present illness/Additional concerns: No concerns today. Gets good numbers at home when she checks. Just doing one of the lisinopril-hctz 20-25. Occasional leg cramps. She is taking the potassium along with this.   Not noting the headaches like she was.   Past Medical History:  Diagnosis Date  . Anemia   . Headache(784.0)   . History of migraine   . Hypertension    Past Surgical History:  Procedure Laterality Date  . APPENDECTOMY    . [place]    . MYOMECTOMY VAGINAL APPROACH  2008   Allergies  Allergen Reactions  . Amlodipine     fatigue   Current Meds  Medication Sig  . Blood Pressure Monitoring (BLOOD PRESSURE CUFF) MISC 1 Device by Does not apply route daily.  Marland Kitchen desogestrel-ethinyl estradiol (ISIBLOOM) 0.15-30 MG-MCG tablet Take 1 tablet by mouth daily.  Marland Kitchen lisinopril-hydrochlorothiazide (ZESTORETIC) 20-25 MG tablet Take 1 tablet by mouth at bedtime.  . Multiple Vitamin (MULTIVITAMIN) tablet Take 1 tablet by mouth daily.  . potassium chloride SA (KLOR-CON) 20 MEQ tablet TAKE 1 TABLET(20 MEQ) BY MOUTH TWICE DAILY  . [DISCONTINUED] lisinopril-hydrochlorothiazide (ZESTORETIC) 20-25 MG tablet Take 2 tablets by mouth at bedtime.   Social History   Tobacco Use  . Smoking status: Never Smoker  . Smokeless tobacco: Never Used  Substance Use Topics  . Alcohol use: No   Family History  Problem Relation Age of Onset  . Hypertension Father   . Diabetes Father   . Kidney disease Father   . Healthy Sister   . Cancer Maternal Grandmother        colon   . Colon cancer Maternal Grandmother   . Thyroid disease Mother   . Breast cancer Mother 103  . Cancer Maternal Grandfather        colon  . Pancreatic cancer Maternal Uncle   . Esophageal cancer Neg Hx   . Rectal cancer Neg Hx   . Stomach cancer Neg Hx      Review of Systems   Constitutional: Negative for activity change, appetite change, chills, fatigue, fever and unexpected weight change.  HENT: Negative for congestion, ear pain, hearing loss, sinus pressure, sinus pain, sore throat and trouble swallowing.   Eyes: Negative for pain and visual disturbance.  Respiratory: Negative for cough, chest tightness, shortness of breath and wheezing.   Cardiovascular: Negative for chest pain, palpitations and leg swelling.  Gastrointestinal: Negative for abdominal pain, blood in stool, constipation, diarrhea, nausea and vomiting.  Genitourinary: Negative for difficulty urinating and menstrual problem.  Musculoskeletal: Negative for arthralgias and back pain.  Skin: Negative for rash.  Neurological: Negative for dizziness, weakness, numbness and headaches.  Hematological: Negative for adenopathy. Does not bruise/bleed easily.  Psychiatric/Behavioral: Negative for sleep disturbance and suicidal ideas. The patient is not nervous/anxious.     CBC:  Lab Results  Component Value Date   WBC 8.0 02/23/2020   WBC 8.1 10/06/2019   HGB 11.8 (L) 02/23/2020   HCT 36.4 02/23/2020   MCH 22.6 (L) 02/23/2020   MCHC 32.4 02/23/2020   RDW 14.2 02/23/2020   PLT 281 02/23/2020   MPV 10.0 05/06/2013   CMP: Lab Results  Component Value Date   NA 139 04/12/2020   NA 138 05/03/2014   K 3.7 04/12/2020   CL 103 04/12/2020   CO2 26 04/12/2020   ANIONGAP 14 02/23/2020  GLUCOSE 75 04/12/2020   BUN 11 04/12/2020   BUN 15 05/03/2014   CREATININE 1.32 (H) 04/12/2020   GFRAA >60 02/23/2020   CALCIUM 9.3 04/12/2020   PROT 8.2 (H) 02/23/2020   BILITOT 0.4 02/23/2020   ALKPHOS 62 02/23/2020   ALT 12 02/23/2020   AST 19 02/23/2020   LIPID: Lab Results  Component Value Date   CHOL 157 04/12/2020   TRIG 119 04/12/2020   HDL 60 04/12/2020   LDLCALC 76 04/12/2020    Objective:  BP 108/78 (BP Location: Left Arm, Patient Position: Sitting, Cuff Size: Large)   Pulse 86   Temp  98.2 F (36.8 C) (Oral)   Ht 5\' 6"  (1.676 m)   Wt 183 lb 12.8 oz (83.4 kg)   LMP 03/22/2020 (Exact Date)   SpO2 99%   BMI 29.67 kg/m   Weight: 183 lb 12.8 oz (83.4 kg)   BP Readings from Last 3 Encounters:  04/12/20 108/78  02/16/20 128/70  01/26/20 139/83   Wt Readings from Last 3 Encounters:  04/12/20 183 lb 12.8 oz (83.4 kg)  02/16/20 184 lb 3.2 oz (83.6 kg)  12/20/19 185 lb 12.8 oz (84.3 kg)    Physical Exam Constitutional:      General: She is not in acute distress.    Appearance: She is well-developed.  HENT:     Head: Normocephalic and atraumatic.     Right Ear: External ear normal.     Left Ear: External ear normal.     Mouth/Throat:     Pharynx: No oropharyngeal exudate.  Eyes:     Conjunctiva/sclera: Conjunctivae normal.     Pupils: Pupils are equal, round, and reactive to light.  Neck:     Thyroid: No thyromegaly.  Cardiovascular:     Rate and Rhythm: Normal rate and regular rhythm.     Heart sounds: Normal heart sounds. No murmur heard.  No friction rub. No gallop.   Pulmonary:     Effort: Pulmonary effort is normal.     Breath sounds: Normal breath sounds.  Abdominal:     General: Bowel sounds are normal. There is no distension.     Palpations: Abdomen is soft. There is no mass.     Tenderness: There is no abdominal tenderness. There is no guarding.     Hernia: No hernia is present.  Musculoskeletal:        General: No tenderness or deformity. Normal range of motion.     Cervical back: Normal range of motion and neck supple.  Lymphadenopathy:     Cervical: No cervical adenopathy.  Skin:    General: Skin is warm and dry.     Findings: No rash.  Neurological:     Mental Status: She is alert and oriented to person, place, and time.     Deep Tendon Reflexes: Reflexes normal.     Reflex Scores:      Tricep reflexes are 2+ on the right side and 2+ on the left side.      Bicep reflexes are 2+ on the right side and 2+ on the left side.       Brachioradialis reflexes are 2+ on the right side and 2+ on the left side.      Patellar reflexes are 2+ on the right side and 2+ on the left side. Psychiatric:        Speech: Speech normal.        Behavior: Behavior normal.  Thought Content: Thought content normal.     Assessment/Plan: Health Maintenance Due  Topic Date Due  . PAP SMEAR-Modifier  06/02/2020   Health Maintenance reviewed.  1.Preventative health care - Lipid panel; Future - Lipid panel  2. Iron deficiency anemia, unspecified iron deficiency anemia type Didn't note improvement with iron infusion, but would like to maintain levels. She requests recheck today.  - Ferritin; Future - Ferritin  3. Essential hypertension bp well controlled with current medication. Continue this. She is tolerating without side effects.  - lisinopril-hydrochlorothiazide (ZESTORETIC) 20-25 MG tablet; Take 1 tablet by mouth at bedtime.  Dispense: 180 tablet; Refill: 1 - Basic metabolic panel; Future - Basic metabolic panel  4. Hypokalemia - Basic metabolic panel; Future - Basic metabolic panel  5. Need for Tdap vaccination - Tdap vaccine greater than or equal to 7yo IM  Encouraged her to consider shingles vaccination and information given on this. Return in about 6 months (around 10/10/2020) for Chronic condition visit.  Micheline Rough, MD

## 2020-04-13 LAB — BASIC METABOLIC PANEL
BUN/Creatinine Ratio: 8 (calc) (ref 6–22)
BUN: 11 mg/dL (ref 7–25)
CO2: 26 mmol/L (ref 20–32)
Calcium: 9.3 mg/dL (ref 8.6–10.4)
Chloride: 103 mmol/L (ref 98–110)
Creat: 1.32 mg/dL — ABNORMAL HIGH (ref 0.50–1.05)
Glucose, Bld: 75 mg/dL (ref 65–99)
Potassium: 3.7 mmol/L (ref 3.5–5.3)
Sodium: 139 mmol/L (ref 135–146)

## 2020-04-13 LAB — LIPID PANEL
Cholesterol: 157 mg/dL (ref <200)
HDL: 60 mg/dL (ref >=50)
LDL Cholesterol (Calc): 76 mg/dL (calc)
Non-HDL Cholesterol (Calc): 97 mg/dL (calc) (ref <130)
Total CHOL/HDL Ratio: 2.6 (calc) (ref <5.0)
Triglycerides: 119 mg/dL (ref <150)

## 2020-04-13 LAB — FERRITIN: Ferritin: 144 ng/mL (ref 16–232)

## 2020-04-29 ENCOUNTER — Other Ambulatory Visit: Payer: Self-pay

## 2020-04-29 ENCOUNTER — Encounter: Payer: Self-pay | Admitting: Family Medicine

## 2020-04-29 ENCOUNTER — Ambulatory Visit (INDEPENDENT_AMBULATORY_CARE_PROVIDER_SITE_OTHER): Payer: 59 | Admitting: Family Medicine

## 2020-04-29 VITALS — BP 110/80 | HR 99 | Temp 98.2°F | Ht 66.0 in | Wt 182.9 lb

## 2020-04-29 DIAGNOSIS — S39013A Strain of muscle, fascia and tendon of pelvis, initial encounter: Secondary | ICD-10-CM | POA: Diagnosis not present

## 2020-04-29 MED ORDER — PREDNISONE 20 MG PO TABS: ORAL_TABLET | ORAL | 0 refills | Status: DC

## 2020-04-29 MED ORDER — CYCLOBENZAPRINE HCL 10 MG PO TABS: 5.0000 mg | ORAL_TABLET | Freq: Two times a day (BID) | ORAL | 0 refills | Status: DC | PRN

## 2020-04-29 NOTE — Patient Instructions (Signed)
Ice hip a few times daily  Take prednisone with food as directed  Flexeril at bedtime to help with muscle spasm.   Let me know if not better in 2 weeks or if worsening.

## 2020-04-29 NOTE — Progress Notes (Signed)
Jean Bond DOB: 10/25/1970 Encounter date: 04/29/2020  This is a 49 y.o. female who presents with Chief Complaint  Patient presents with  . Hip Pain    patient states she "stepped wrong" yesterday and complains of left hip pain since    History of present illness: Did something to side/hip stomach area on left yesterday around 5. Felt big burst of warm; almost like something was bleeding inside of her. Had to sit for awhile and then wasn't able to walk well. Was on walk ramp and stepped off side of it.   Right now hurting laterally. Not had injury on that side before. Left knee and ankle ok.   hasn't noticed bruising or swelling.   Allergies  Allergen Reactions  . Amlodipine     fatigue   Current Meds  Medication Sig  . Blood Pressure Monitoring (BLOOD PRESSURE CUFF) MISC 1 Device by Does not apply route daily.  Marland Kitchen desogestrel-ethinyl estradiol (ISIBLOOM) 0.15-30 MG-MCG tablet Take 1 tablet by mouth daily.  Marland Kitchen lisinopril-hydrochlorothiazide (ZESTORETIC) 20-25 MG tablet Take 1 tablet by mouth at bedtime.  . Multiple Vitamin (MULTIVITAMIN) tablet Take 1 tablet by mouth daily.  . potassium chloride SA (KLOR-CON) 20 MEQ tablet TAKE 1 TABLET(20 MEQ) BY MOUTH TWICE DAILY    Review of Systems  Constitutional: Negative for chills, fatigue and fever.  Respiratory: Negative for cough, chest tightness, shortness of breath and wheezing.   Cardiovascular: Negative for chest pain, palpitations and leg swelling.  Gastrointestinal: Negative for abdominal distention and constipation. Abdominal pain: bowel movement did not change pain; pain is more significant along iliac crest rather than abdomen.    Objective:  BP 110/80 (BP Location: Left Arm, Patient Position: Sitting, Cuff Size: Large)   Pulse 99   Temp 98.2 F (36.8 C) (Oral)   Ht 5\' 6"  (1.676 m)   Wt 182 lb 14.4 oz (83 kg)   BMI 29.52 kg/m   Weight: 182 lb 14.4 oz (83 kg)   BP Readings from Last 3 Encounters:    04/29/20 110/80  04/12/20 108/78  02/16/20 128/70   Wt Readings from Last 3 Encounters:  04/29/20 182 lb 14.4 oz (83 kg)  04/12/20 183 lb 12.8 oz (83.4 kg)  02/16/20 184 lb 3.2 oz (83.6 kg)    Physical Exam Constitutional:      General: She is not in acute distress.    Appearance: She is well-developed.  Cardiovascular:     Rate and Rhythm: Normal rate and regular rhythm.     Heart sounds: Normal heart sounds. No murmur heard.  No friction rub.  Pulmonary:     Effort: Pulmonary effort is normal. No respiratory distress.     Breath sounds: Normal breath sounds. No wheezing or rales.  Musculoskeletal:     Right lower leg: No edema.     Left lower leg: No edema.       Legs:     Comments: Tenderness along iliac crest. Subtle pain with internal rotation hip, but no restriction with ROM of hip flexion/extension. Hurts with active ROM. No trochanteric tenderness. No SI tenderness or spinal tenderness. No appreciated hernia.   Neurological:     Mental Status: She is alert and oriented to person, place, and time.  Psychiatric:        Behavior: Behavior normal.     Assessment/Plan 1. Strain of muscle of pelvis We discussed icing tender area of hip 2-3 times daily.  We discussed stretches that will be helpful once pain  has decreased somewhat.  We are going to have her try Flexeril and to avoid anti-inflammatories due to baseline elevated creatinine.  If any worsening of pain, let us know or if not noting improvement towards end of week.  Okay to use Tylenol for pain.   Return if symptoms worsen or fail to improve.    Micheline Rough, MD

## 2020-05-15 ENCOUNTER — Other Ambulatory Visit: Payer: Self-pay | Admitting: Family Medicine

## 2020-05-15 DIAGNOSIS — I10 Essential (primary) hypertension: Secondary | ICD-10-CM

## 2020-05-29 ENCOUNTER — Encounter (HOSPITAL_COMMUNITY): Payer: Self-pay | Admitting: Emergency Medicine

## 2020-05-29 ENCOUNTER — Other Ambulatory Visit: Payer: Self-pay

## 2020-05-29 ENCOUNTER — Emergency Department (HOSPITAL_COMMUNITY): Payer: 59

## 2020-05-29 ENCOUNTER — Emergency Department (HOSPITAL_COMMUNITY)
Admission: EM | Admit: 2020-05-29 | Discharge: 2020-05-29 | Disposition: A | Discharge status: Home / Self Care | Payer: 59 | Attending: Emergency Medicine | Admitting: Emergency Medicine

## 2020-05-29 DIAGNOSIS — I129 Hypertensive chronic kidney disease with stage 1 through stage 4 chronic kidney disease, or unspecified chronic kidney disease: Secondary | ICD-10-CM | POA: Diagnosis not present

## 2020-05-29 DIAGNOSIS — R1033 Periumbilical pain: Secondary | ICD-10-CM | POA: Diagnosis not present

## 2020-05-29 DIAGNOSIS — R109 Unspecified abdominal pain: Secondary | ICD-10-CM | POA: Diagnosis present

## 2020-05-29 DIAGNOSIS — N183 Chronic kidney disease, stage 3 unspecified: Secondary | ICD-10-CM | POA: Diagnosis not present

## 2020-05-29 DIAGNOSIS — Z79899 Other long term (current) drug therapy: Secondary | ICD-10-CM | POA: Diagnosis not present

## 2020-05-29 LAB — COMPREHENSIVE METABOLIC PANEL
ALT: 13 U/L (ref 0–44)
AST: 19 U/L (ref 15–41)
Albumin: 3.9 g/dL (ref 3.5–5.0)
Alkaline Phosphatase: 57 U/L (ref 38–126)
Anion gap: 16 — ABNORMAL HIGH (ref 5–15)
BUN: 7 mg/dL (ref 6–20)
CO2: 21 mmol/L — ABNORMAL LOW (ref 22–32)
Calcium: 9.2 mg/dL (ref 8.9–10.3)
Chloride: 95 mmol/L — ABNORMAL LOW (ref 98–111)
Creatinine, Ser: 1.2 mg/dL — ABNORMAL HIGH (ref 0.44–1.00)
GFR, Estimated: 55 mL/min — ABNORMAL LOW (ref >60)
Glucose, Bld: 164 mg/dL — ABNORMAL HIGH (ref 70–99)
Potassium: 3.2 mmol/L — ABNORMAL LOW (ref 3.5–5.1)
Sodium: 132 mmol/L — ABNORMAL LOW (ref 135–145)
Total Bilirubin: 0.6 mg/dL (ref 0.3–1.2)
Total Protein: 7.6 g/dL (ref 6.5–8.1)

## 2020-05-29 LAB — CBC
HCT: 35.2 % — ABNORMAL LOW (ref 36.0–46.0)
Hemoglobin: 11.6 g/dL — ABNORMAL LOW (ref 12.0–15.0)
MCH: 23.4 pg — ABNORMAL LOW (ref 26.0–34.0)
MCHC: 33 g/dL (ref 30.0–36.0)
MCV: 71 fL — ABNORMAL LOW (ref 80.0–100.0)
Platelets: 425 10*3/uL — ABNORMAL HIGH (ref 150–400)
RBC: 4.96 MIL/uL (ref 3.87–5.11)
RDW: 13.2 % (ref 11.5–15.5)
WBC: 10.7 10*3/uL — ABNORMAL HIGH (ref 4.0–10.5)
nRBC: 0 % (ref 0.0–0.2)

## 2020-05-29 LAB — URINALYSIS, ROUTINE W REFLEX MICROSCOPIC
Bilirubin Urine: NEGATIVE
Glucose, UA: NEGATIVE mg/dL
Hgb urine dipstick: NEGATIVE
Ketones, ur: NEGATIVE mg/dL
Leukocytes,Ua: NEGATIVE
Nitrite: NEGATIVE
Protein, ur: NEGATIVE mg/dL
Specific Gravity, Urine: 1.013 (ref 1.005–1.030)
pH: 8 (ref 5.0–8.0)

## 2020-05-29 LAB — I-STAT BETA HCG BLOOD, ED (MC, WL, AP ONLY): I-stat hCG, quantitative: <5 m[IU]/mL (ref <5)

## 2020-05-29 LAB — LIPASE, BLOOD: Lipase: 33 U/L (ref 11–51)

## 2020-05-29 MED ORDER — IOHEXOL 300 MG/ML  SOLN
75.0000 mL | Freq: Once | INTRAMUSCULAR | Status: AC | PRN
  Administered 2020-05-29: 75 mL via INTRAVENOUS

## 2020-05-29 MED ORDER — SODIUM CHLORIDE 0.9 % IV BOLUS
1000.0000 mL | Freq: Once | INTRAVENOUS | Status: AC
  Administered 2020-05-29: 1000 mL via INTRAVENOUS

## 2020-05-29 MED ORDER — MORPHINE SULFATE (PF) 4 MG/ML IV SOLN
4.0000 mg | Freq: Once | INTRAVENOUS | Status: AC
  Administered 2020-05-29: 4 mg via INTRAVENOUS
  Filled 2020-05-29: qty 1

## 2020-05-29 MED ORDER — ONDANSETRON HCL 4 MG/2ML IJ SOLN
4.0000 mg | Freq: Once | INTRAMUSCULAR | Status: AC
  Administered 2020-05-29: 4 mg via INTRAVENOUS
  Filled 2020-05-29: qty 2

## 2020-05-29 MED ORDER — DICYCLOMINE HCL 20 MG PO TABS: 20.0000 mg | ORAL_TABLET | Freq: Two times a day (BID) | ORAL | 0 refills | Status: DC

## 2020-05-29 NOTE — Discharge Instructions (Signed)
Please read and follow all provided instructions.  Your diagnoses today include:  1. Periumbilical abdominal pain     Tests performed today include:  Blood counts and electrolytes -slightly high white blood cell count, slightly low potassium  Blood tests to check liver and kidney function  Blood tests to check pancreas function  Urine test to look for infection and pregnancy (in women)  EKG -no suggestion of a heart problem  CT scan - no concerning findings to suggest infection, obstruction, other problems  Vital signs. See below for your results today.   Medications prescribed:   Bentyl - medication for intestinal cramps and spasms  Take any prescribed medications only as directed.  Home care instructions:   Follow any educational materials contained in this packet.  Follow-up instructions: Please follow-up with your primary care provider in the next 2 days for further evaluation of your symptoms.    Return instructions:  SEEK IMMEDIATE MEDICAL ATTENTION IF:  The pain does not go away or becomes severe   A temperature above 101F develops   Repeated vomiting occurs (multiple episodes)   The pain becomes localized to portions of the abdomen. The right side could possibly be appendicitis. In an adult, the left lower portion of the abdomen could be colitis or diverticulitis.   Blood is being passed in stools or vomit (bright red or black tarry stools)   You develop chest pain, difficulty breathing, dizziness or fainting, or become confused, poorly responsive, or inconsolable (young children)  If you have any other emergent concerns regarding your health  Additional Information: Abdominal (belly) pain can be caused by many things. Your caregiver performed an examination and possibly ordered blood/urine tests and imaging (CT scan, x-rays, ultrasound). Many cases can be observed and treated at home after initial evaluation in the emergency department. Even though you  are being discharged home, abdominal pain can be unpredictable. Therefore, you need a repeated exam if your pain does not resolve, returns, or worsens. Most patients with abdominal pain don't have to be admitted to the hospital or have surgery, but serious problems like appendicitis and gallbladder attacks can start out as nonspecific pain. Many abdominal conditions cannot be diagnosed in one visit, so follow-up evaluations are very important.  Your vital signs today were: BP 133/84    Pulse 80    Temp 97.7 F (36.5 C) (Oral)    Resp 18    LMP 05/15/2020    SpO2 100%  If your blood pressure (bp) was elevated above 135/85 this visit, please have this repeated by your doctor within one month. --------------

## 2020-05-29 NOTE — ED Notes (Signed)
Patient transported to CT 

## 2020-05-29 NOTE — ED Provider Notes (Signed)
[facility] EMERGENCY DEPARTMENT Provider Note   CSN: [id number] Arrival date & time: 05/29/20  4166     History Chief Complaint  Patient presents with  . Abdominal Pain    Jean Bond is a 49 y.o. female.  Patient with history of appendectomy and myomectomy presents the emergency department today for mid abdominal pain starting yesterday.  Patient states that for the most of yesterday she "felt off".  Last evening she became dizzy, which she describes as a spinning sensation.  She had associated pain described as a "deep cramping" in her mid abdomen.  This led to dry heaves but no vomiting.  She denies diarrhea, urinary symptoms, vaginal bleeding or discharge.  She felt so dizzy that she needed to slide down her steps at home.  She also reported development of ankle cramping which was improved with massage.  Patient took left over potassium because of cramping.  No suspicious food or water exposures.  No known sick contacts.  Patient denies fever or URI symptoms.  No chest pain or shortness of breath.  She denies heavy NSAID use and denies alcohol use.        Past Medical History:  Diagnosis Date  . Anemia   . Headache(784.0)   . History of migraine   . Hypertension     Patient Active Problem List   Diagnosis Date Noted  . Snoring 11/13/2019  . Iron deficiency anemia 03/16/2018  . CKD (chronic kidney disease), stage III ([place]) 03/16/2018  . Hypertension 02/17/2007    Past Surgical History:  Procedure Laterality Date  . APPENDECTOMY    . [place]    . MYOMECTOMY VAGINAL APPROACH  2008     OB History    Gravida  2   Para  2   Term      Preterm      AB      Living        SAB      TAB      Ectopic      Multiple      Live Births              Family History  Problem Relation Age of Onset  . Hypertension Father   . Diabetes Father   . Kidney disease Father   . Healthy Sister   . Cancer Maternal Grandmother         colon   . Colon cancer Maternal Grandmother   . Thyroid disease Mother   . Breast cancer Mother 38  . Cancer Maternal Grandfather        colon  . Pancreatic cancer Maternal Uncle   . Esophageal cancer Neg Hx   . Rectal cancer Neg Hx   . Stomach cancer Neg Hx     Social History   Tobacco Use  . Smoking status: Never Smoker  . Smokeless tobacco: Never Used  Vaping Use  . Vaping Use: Never used  Substance Use Topics  . Alcohol use: No  . Drug use: No    Home Medications Prior to Admission medications   Medication Sig Start Date End Date Taking? Authorizing Provider  Blood Pressure Monitoring (BLOOD PRESSURE CUFF) MISC 1 Device by Does not apply route daily. 03/09/18   Caren Macadam, MD  cyclobenzaprine (FLEXERIL) 10 MG tablet Take 0.5-1 tablets (5-10 mg total) by mouth 2 (two) times daily as needed for muscle spasms. 04/29/20   Caren Macadam, MD  desogestrel-ethinyl estradiol (ISIBLOOM) 0.15-30  MG-MCG tablet Take 1 tablet by mouth daily.    [provider]  lisinopril-hydrochlorothiazide (ZESTORETIC) 20-25 MG tablet TAKE 2 TABLETS BY MOUTH AT BEDTIME 05/15/20   Koberlein, Steele Berg, MD  Multiple Vitamin (MULTIVITAMIN) tablet Take 1 tablet by mouth daily.    [provider]  potassium chloride SA (KLOR-CON) 20 MEQ tablet TAKE 1 TABLET(20 MEQ) BY MOUTH TWICE DAILY 02/23/20   Orson Slick, MD  predniSONE (DELTASONE) 20 MG tablet Take 3 tablets daily x 3 days, then 2 tablets daily x 3 days, then 1 tablet daily 3 days, then 1/2 tablet daily x 2 days 04/29/20   Caren Macadam, MD    Allergies    Amlodipine  Review of Systems   Review of Systems  Constitutional: Negative for fever.  HENT: Negative for rhinorrhea and sore throat.   Eyes: Negative for redness.  Respiratory: Negative for cough.   Cardiovascular: Negative for chest pain.  Gastrointestinal: Positive for abdominal pain. Negative for diarrhea, nausea and vomiting.  Genitourinary:  Negative for dysuria, frequency, hematuria and urgency.  Musculoskeletal: Positive for myalgias.  Skin: Negative for rash.  Neurological: Positive for dizziness. Negative for light-headedness, numbness and headaches.    Physical Exam Updated Vital Signs BP (!) 133/93   Pulse 100   Temp 97.7 F (36.5 C) (Oral)   Resp 17   LMP 05/15/2020   SpO2 100%   Physical Exam Vitals and nursing note reviewed.  Constitutional:      General: She is not in acute distress.    Appearance: She is well-developed.  HENT:     Head: Normocephalic and atraumatic.     Right Ear: External ear normal.     Left Ear: External ear normal.     Nose: Nose normal.  Eyes:     Conjunctiva/sclera: Conjunctivae normal.  Cardiovascular:     Rate and Rhythm: Normal rate and regular rhythm.     Heart sounds: No murmur heard.   Pulmonary:     Effort: No respiratory distress.     Breath sounds: No wheezing, rhonchi or rales.  Abdominal:     Palpations: Abdomen is soft.     Tenderness: There is abdominal tenderness (Mild to moderate) in the epigastric area and periumbilical area. There is no guarding or rebound. Negative signs include Murphy's sign and McBurney's sign.  Musculoskeletal:     Cervical back: Normal range of motion and neck supple.     Right lower leg: No edema.     Left lower leg: No edema.  Skin:    General: Skin is warm and dry.     Findings: No rash.  Neurological:     General: No focal deficit present.     Mental Status: She is alert. Mental status is at baseline.     Motor: No weakness.  Psychiatric:        Mood and Affect: Mood normal.     ED Results / Procedures / Treatments   Labs (all labs ordered are listed, but only abnormal results are displayed) Labs Reviewed  COMPREHENSIVE METABOLIC PANEL - Abnormal; Notable for the following components:      Result Value   Sodium 132 (*)    Potassium 3.2 (*)    Chloride 95 (*)    CO2 21 (*)    Glucose, Bld 164 (*)    Creatinine, Ser  1.20 (*)    GFR, Estimated 55 (*)    Anion gap 16 (*)    All  other components within normal limits  CBC - Abnormal; Notable for the following components:   WBC 10.7 (*)    Hemoglobin 11.6 (*)    HCT 35.2 (*)    MCV 71.0 (*)    MCH 23.4 (*)    Platelets 425 (*)    All other components within normal limits  LIPASE, BLOOD  URINALYSIS, ROUTINE W REFLEX MICROSCOPIC  I-STAT BETA HCG BLOOD, ED ([facility], [facility], [facility] ONLY)    EKG EKG Interpretation  Date/Time:  Wednesday May 29 2020 06:14:22 EDT Ventricular Rate:  98 PR Interval:    QRS Duration: 88 QT Interval:  445 QTC Calculation: 569 R Axis:   60 Text Interpretation: Sinus rhythm Borderline repolarization abnormality Prolonged QT interval agree. no acute ischemic appaerance. no old comparison Confirmed by Charlesetta Shanks [phone]) on 05/29/2020 7:04:03 AM   Radiology No results found.  Procedures Procedures (including critical care time)  Medications Ordered in ED Medications  sodium chloride 0.9 % bolus 1,000 mL (has no administration in time range)  ondansetron (ZOFRAN) injection 4 mg (has no administration in time range)    ED Course  I have reviewed the triage vital signs and the nursing notes.  Pertinent labs & imaging results that were available during my care of the patient were reviewed by me and considered in my medical decision making (see chart for details).  Patient seen and examined.  Her initial exam is equivocal.  No rebound or guarding.  She does not appear to be in any distress.  Labs with minimally elevated white blood cell count, mildly elevated kidney function.  Slightly low potassium and potassium.  Will give fluid bolus, antiemetic, reassess.  Vital signs reviewed and are as follows: BP (!) 133/93   Pulse 100   Temp 97.7 F (36.5 C) (Oral)   Resp 17   LMP 05/15/2020   SpO2 100%   8:07 AM Pt rechecked. She has not yet received treatments ordered. RN at bedside looking for access. Exam is unchanged.  Discussed lab results with husband at bedside. EKG reviewed.   9:23 AM patient rechecked.  Exam unchanged.  I had a discussion regarding how to proceed with patient and husband at bedside.  We discussed symptom control and watchful waiting versus CT scan to evaluate for more serious causes of her abdominal pain today.  Discussed risks and benefits of imaging.  Given severity of pain overnight, family and patient would feel most comfortable with imaging.  I have ordered a CT of the abdomen and pelvis and pain medication for patient.  We will continue to monitor.  10:37 AM CT reviewed. Pt and husband updated on results.  She has received pain medication.  Will fluid challenge.  She states that her dizziness is better.  If she does well, plan for discharge to home with antispasmodics, bland diet.  11:45 PM I gave patient ginger ale.   12:17 PM Pt doing well, ready for discharge.   Plan: home with rx: bentyl.  The patient was urged to return to the Emergency Department immediately with worsening of current symptoms, worsening abdominal pain, persistent vomiting, blood noted in stools, fever, or any other concerns. The patient verbalized understanding.     MDM Rules/Calculators/A&P                          Patient with periumbilical abdominal pain. Vitals are stable, no fever. Labs mild leukocytosis otherwise reassuring, clear urine. Imaging neg CT, unremarkable ekg.  No signs of dehydration, patient is tolerating PO's. Lungs are clear and no signs suggestive of PNA. Low concern for appendicitis, cholecystitis, pancreatitis, ruptured viscus, UTI, kidney stone, aortic dissection, aortic aneurysm or other emergent abdominal etiology. Supportive therapy indicated with return if symptoms worsen.     Final Clinical Impression(s) / ED Diagnoses Final diagnoses:  Periumbilical abdominal pain    Rx / DC Orders ED Discharge Orders         Ordered    dicyclomine (BENTYL) 20 MG tablet  2 times daily         05/29/20 1220           Carlisle Cater, PA-C 05/29/20 1221    Palumbo, April, MD 05/29/20 2308

## 2020-05-29 NOTE — ED Triage Notes (Signed)
Patient reports mid abdominal pain with nausea onset last night , no diarrhea or fever .

## 2020-07-22 ENCOUNTER — Other Ambulatory Visit: Payer: 59

## 2020-07-22 DIAGNOSIS — Z20822 Contact with and (suspected) exposure to covid-19: Secondary | ICD-10-CM

## 2020-07-24 ENCOUNTER — Other Ambulatory Visit: Payer: Self-pay | Admitting: Obstetrics and Gynecology

## 2020-07-24 DIAGNOSIS — R928 Other abnormal and inconclusive findings on diagnostic imaging of breast: Secondary | ICD-10-CM

## 2020-07-24 LAB — SARS-COV-2, NAA 2 DAY TAT

## 2020-07-24 LAB — NOVEL CORONAVIRUS, NAA: SARS-CoV-2, NAA: NOT DETECTED

## 2020-07-29 ENCOUNTER — Other Ambulatory Visit: Payer: 59

## 2020-07-29 DIAGNOSIS — Z20822 Contact with and (suspected) exposure to covid-19: Secondary | ICD-10-CM

## 2020-07-30 LAB — NOVEL CORONAVIRUS, NAA: SARS-CoV-2, NAA: NOT DETECTED

## 2020-07-30 LAB — SARS-COV-2, NAA 2 DAY TAT

## 2020-08-06 ENCOUNTER — Ambulatory Visit
Admission: RE | Admit: 2020-08-06 | Discharge: 2020-08-06 | Disposition: A | Discharge status: Home / Self Care | Payer: BC Managed Care – PPO | Source: Ambulatory Visit | Attending: Obstetrics and Gynecology | Admitting: Obstetrics and Gynecology

## 2020-08-06 ENCOUNTER — Other Ambulatory Visit: Payer: Self-pay

## 2020-08-06 ENCOUNTER — Ambulatory Visit
Admission: RE | Admit: 2020-08-06 | Discharge: 2020-08-06 | Disposition: A | Discharge status: Home / Self Care | Payer: 59 | Source: Ambulatory Visit | Attending: Obstetrics and Gynecology | Admitting: Obstetrics and Gynecology

## 2020-08-06 DIAGNOSIS — R928 Other abnormal and inconclusive findings on diagnostic imaging of breast: Secondary | ICD-10-CM

## 2020-08-13 ENCOUNTER — Other Ambulatory Visit: Payer: 59

## 2020-09-23 ENCOUNTER — Encounter: Payer: Self-pay | Admitting: Family Medicine

## 2020-09-23 ENCOUNTER — Telehealth: Payer: Self-pay | Admitting: Family Medicine

## 2020-09-23 DIAGNOSIS — I10 Essential (primary) hypertension: Secondary | ICD-10-CM

## 2020-09-23 MED ORDER — LISINOPRIL-HYDROCHLOROTHIAZIDE 20-25 MG PO TABS: 2.0000 | ORAL_TABLET | Freq: Every day | ORAL | 0 refills | Status: DC

## 2020-09-23 NOTE — Telephone Encounter (Signed)
Rx done. 

## 2020-09-23 NOTE — Telephone Encounter (Signed)
Patient is calling and requesting a refill for lisinopril-hydrochlorothiazide (ZESTORETIC) 20-25 MG tablet sent to [facility], [address], [place] [address]  Phone:  [phone] Fax:  [phone]. CB is [phone]

## 2020-09-24 NOTE — Telephone Encounter (Signed)
Patient is calling to check the status for medication refill, please advise. CB is [phone]

## 2020-09-25 MED ORDER — LISINOPRIL-HYDROCHLOROTHIAZIDE 20-25 MG PO TABS: 2.0000 | ORAL_TABLET | Freq: Every day | ORAL | 0 refills | Status: DC

## 2020-10-11 ENCOUNTER — Ambulatory Visit: Payer: 59 | Admitting: Family Medicine

## 2021-01-11 ENCOUNTER — Other Ambulatory Visit: Payer: Self-pay | Admitting: Family Medicine

## 2021-01-11 DIAGNOSIS — I10 Essential (primary) hypertension: Secondary | ICD-10-CM

## 2021-02-12 ENCOUNTER — Telehealth (INDEPENDENT_AMBULATORY_CARE_PROVIDER_SITE_OTHER): Payer: BC Managed Care – PPO | Admitting: Family Medicine

## 2021-02-12 ENCOUNTER — Encounter: Payer: Self-pay | Admitting: Hematology and Oncology

## 2021-02-12 ENCOUNTER — Other Ambulatory Visit: Payer: Self-pay

## 2021-02-12 DIAGNOSIS — R059 Cough, unspecified: Secondary | ICD-10-CM | POA: Diagnosis not present

## 2021-02-12 MED ORDER — HYDROCODONE BIT-HOMATROP MBR 5-1.5 MG/5ML PO SOLN: 5.0000 mL | Freq: Three times a day (TID) | ORAL | 0 refills | Status: DC | PRN

## 2021-02-12 NOTE — Progress Notes (Signed)
Patient ID: Jean Bond, female   DOB: [birth year], 50 y.o.   MRN: [id number]  This visit type was conducted due to national recommendations for restrictions regarding the COVID-19 pandemic in an effort to limit this patient's exposure and mitigate transmission in our community.   Virtual Visit via Video Note  I connected with Sheryle Spray on 02/12/21 at  5:00 PM EDT by a video enabled telemedicine application and verified that I am speaking with the correct person using two identifiers.  Location patient: home Location provider:work or home office Persons participating in the virtual visit: patient, provider  I discussed the limitations of evaluation and management by telemedicine and the availability of in person appointments. The patient expressed understanding and agreed to proceed.   HPI:  Jessicca call with persistent cough following upper respiratory infection which started at end of June.  Her husband actually tested positive for COVID.  Her home test was negative and she never got this retested.  She had exactly the same symptoms that he had.  She initially had some low-grade fever, chills, nasal congestion, cough.  Basically her other symptoms have resolved but she has had some continued cough especially at night.  This is interfering with sleep.  No relief with Mucinex and over-the-counter medications.  No hemoptysis.  Non-smoker.  No obvious wheezing.  No dyspnea.  She would like to consider cough medication at night to help with symptoms.  She does take ACE inhibitor with lisinopril HCTZ but denied any chronic cough prior to recent infection.  ROS: See pertinent positives and negatives per HPI.  Past Medical History:  Diagnosis Date   Anemia    Headache(784.0)    History of migraine    Hypertension     Past Surgical History:  Procedure Laterality Date   APPENDECTOMY     MYOMECHOMY     MYOMECTOMY VAGINAL APPROACH  2008    Family History  Problem Relation  Age of Onset   Hypertension Father    Diabetes Father    Kidney disease Father    Healthy Sister    Cancer Maternal Grandmother        colon    Colon cancer Maternal Grandmother    Thyroid disease Mother    Breast cancer Mother 22   Cancer Maternal Grandfather        colon   Pancreatic cancer Maternal Uncle    Esophageal cancer Neg Hx    Rectal cancer Neg Hx    Stomach cancer Neg Hx     SOCIAL HX: Non-smoker   Current Outpatient Medications:    Blood Pressure Monitoring (BLOOD PRESSURE CUFF) MISC, 1 Device by Does not apply route daily., Disp: 1 each, Rfl: 0   cyclobenzaprine (FLEXERIL) 10 MG tablet, Take 0.5-1 tablets (5-10 mg total) by mouth 2 (two) times daily as needed for muscle spasms. (Patient taking differently: Take 10 mg by mouth 2 (two) times daily as needed for muscle spasms.), Disp: 30 tablet, Rfl: 0   desogestrel-ethinyl estradiol (ISIBLOOM) 0.15-30 MG-MCG tablet, Take 1 tablet by mouth daily. , Disp: , Rfl:    dicyclomine (BENTYL) 20 MG tablet, Take 1 tablet (20 mg total) by mouth 2 (two) times daily., Disp: 20 tablet, Rfl: 0   HYDROcodone bit-homatropine (HYCODAN) 5-1.5 MG/5ML syrup, Take 5 mLs by mouth every 8 (eight) hours as needed for cough., Disp: 120 mL, Rfl: 0   lisinopril-hydrochlorothiazide (ZESTORETIC) 20-25 MG tablet, TAKE 2 TABLETS BY MOUTH AT BEDTIME, Disp: 180 tablet, Rfl: 1  Multiple Vitamin (MULTIVITAMIN) tablet, Take 1 tablet by mouth daily., Disp: , Rfl:    potassium chloride SA (KLOR-CON) 20 MEQ tablet, TAKE 1 TABLET(20 MEQ) BY MOUTH TWICE DAILY (Patient taking differently: Take 20 mEq by mouth daily.), Disp: 15 tablet, Rfl: 0  EXAM:  VITALS per patient if applicable:  GENERAL: alert, oriented, appears well and in no acute distress  HEENT: atraumatic, conjunttiva clear, no obvious abnormalities on inspection of external nose and ears  NECK: normal movements of the head and neck  LUNGS: on inspection no signs of respiratory distress,  breathing rate appears normal, no obvious gross SOB, gasping or wheezing  CV: no obvious cyanosis  MS: moves all visible extremities without noticeable abnormality  PSYCH/NEURO: pleasant and cooperative, no obvious depression or anxiety, speech and thought processing grossly intact  ASSESSMENT AND PLAN:  Discussed the following assessment and plan:  Cough.  Patient likely had recent COVID infection from history.  She is in no respiratory distress.  Does not have any fever or dyspnea or other concerning symptoms.  Cough interfering with sleep.  -Hycodan cough syrup 1 teaspoon every 6 hours as needed for severe cough.  She is aware this may be sedating. -Follow-up immediately for any fever, shortness of breath, or any persistent cough     I discussed the assessment and treatment plan with the patient. The patient was provided an opportunity to ask questions and all were answered. The patient agreed with the plan and demonstrated an understanding of the instructions.   The patient was advised to call back or seek an in-person evaluation if the symptoms worsen or if the condition fails to improve as anticipated.     Carolann Littler, MD

## 2021-04-14 ENCOUNTER — Other Ambulatory Visit: Payer: Self-pay

## 2021-04-14 ENCOUNTER — Encounter: Payer: Self-pay | Admitting: Family Medicine

## 2021-04-14 ENCOUNTER — Ambulatory Visit (INDEPENDENT_AMBULATORY_CARE_PROVIDER_SITE_OTHER): Payer: BC Managed Care – PPO | Admitting: Family Medicine

## 2021-04-14 VITALS — BP 108/78 | HR 98 | Temp 98.0°F | Ht 66.0 in | Wt 186.8 lb

## 2021-04-14 DIAGNOSIS — R42 Dizziness and giddiness: Secondary | ICD-10-CM

## 2021-04-14 DIAGNOSIS — R109 Unspecified abdominal pain: Secondary | ICD-10-CM

## 2021-04-14 DIAGNOSIS — D500 Iron deficiency anemia secondary to blood loss (chronic): Secondary | ICD-10-CM

## 2021-04-14 DIAGNOSIS — Z13220 Encounter for screening for lipoid disorders: Secondary | ICD-10-CM

## 2021-04-14 DIAGNOSIS — Z0000 Encounter for general adult medical examination without abnormal findings: Secondary | ICD-10-CM | POA: Diagnosis not present

## 2021-04-14 DIAGNOSIS — I10 Essential (primary) hypertension: Secondary | ICD-10-CM | POA: Diagnosis not present

## 2021-04-14 LAB — CBC WITH DIFFERENTIAL/PLATELET
Basophils Absolute: 0 10*3/uL (ref 0.0–0.1)
Basophils Relative: 0.5 % (ref 0.0–3.0)
Eosinophils Absolute: 0.3 10*3/uL (ref 0.0–0.7)
Eosinophils Relative: 3 % (ref 0.0–5.0)
HCT: 36.9 % (ref 36.0–46.0)
Hemoglobin: 11.7 g/dL — ABNORMAL LOW (ref 12.0–15.0)
Lymphocytes Relative: 29.9 % (ref 12.0–46.0)
Lymphs Abs: 2.6 10*3/uL (ref 0.7–4.0)
MCHC: 31.8 g/dL (ref 30.0–36.0)
MCV: 70.4 fl — ABNORMAL LOW (ref 78.0–100.0)
Monocytes Absolute: 0.4 10*3/uL (ref 0.1–1.0)
Monocytes Relative: 4.3 % (ref 3.0–12.0)
Neutro Abs: 5.3 10*3/uL (ref 1.4–7.7)
Neutrophils Relative %: 62.3 % (ref 43.0–77.0)
Platelets: 309 10*3/uL (ref 150.0–400.0)
RBC: 5.23 Mil/uL — ABNORMAL HIGH (ref 3.87–5.11)
RDW: 14 % (ref 11.5–15.5)
WBC: 8.5 10*3/uL (ref 4.0–10.5)

## 2021-04-14 LAB — LIPID PANEL
Cholesterol: 150 mg/dL (ref 0–200)
HDL: 57.7 mg/dL (ref >39.00)
LDL Cholesterol: 58 mg/dL (ref 0–99)
NonHDL: 92.44
Total CHOL/HDL Ratio: 3
Triglycerides: 170 mg/dL — ABNORMAL HIGH (ref 0.0–149.0)
VLDL: 34 mg/dL (ref 0.0–40.0)

## 2021-04-14 LAB — TSH: TSH: 5.37 u[IU]/mL (ref 0.35–5.50)

## 2021-04-14 LAB — COMPREHENSIVE METABOLIC PANEL
ALT: 10 U/L (ref 0–35)
AST: 12 U/L (ref 0–37)
Albumin: 4.1 g/dL (ref 3.5–5.2)
Alkaline Phosphatase: 56 U/L (ref 39–117)
BUN: 12 mg/dL (ref 6–23)
CO2: 25 mEq/L (ref 19–32)
Calcium: 9.1 mg/dL (ref 8.4–10.5)
Chloride: 100 mEq/L (ref 96–112)
Creatinine, Ser: 1.34 mg/dL — ABNORMAL HIGH (ref 0.40–1.20)
GFR: 45.75 mL/min — ABNORMAL LOW (ref >60.00)
Glucose, Bld: 96 mg/dL (ref 70–99)
Potassium: 3.7 mEq/L (ref 3.5–5.1)
Sodium: 137 mEq/L (ref 135–145)
Total Bilirubin: 0.5 mg/dL (ref 0.2–1.2)
Total Protein: 7.3 g/dL (ref 6.0–8.3)

## 2021-04-14 LAB — IBC + FERRITIN
Ferritin: 39.7 ng/mL (ref 10.0–291.0)
Iron: 69 ug/dL (ref 42–145)
Saturation Ratios: 18.2 % — ABNORMAL LOW (ref 20.0–50.0)
TIBC: 379.4 ug/dL (ref 250.0–450.0)
Transferrin: 271 mg/dL (ref 212.0–360.0)

## 2021-04-14 LAB — VITAMIN B12: Vitamin B-12: 335 pg/mL (ref 211–911)

## 2021-04-14 LAB — LIPASE: Lipase: 37 U/L (ref 11.0–59.0)

## 2021-04-14 NOTE — Addendum Note (Signed)
Addended by: Amanda Cockayne on: 04/14/2021 08:56 AM   Modules accepted: Orders

## 2021-04-14 NOTE — Progress Notes (Signed)
Jean Bond DOB: 1971/02/05 Encounter date: 04/14/2021  This is a 50 y.o. female who presents for complete physical   History of present illness/Additional concerns:  Stomach has been hurting and has been having some dizziness/vertigo. Sometimes epigastric. Sometimes RLQ. Sometimes hurt, but sometimes nausea. No obvious triggers for her (not always better/worse with bm or with eating/not eating). Dizziness  - had bad spell a couple of weeks ago. Needed to lay down and have head is certain position to not feel dizzy. Stomach was hurting/feeling upset at that time. Lasted for part of that day. Feels like her eyes are going side to side (husband tells her they aren't moving). No vomiting. Has had headaches associated with some of episodes. Not migraine like she has had in past; just headache. Pain is left frontal. No sinus sx, no ear pain/pressure. Always has nose running/not new. Did have some ear pain with flights this summer.   Her cough post - covid (suspected) has resolved.   Falling asleep quickly. Does feel fatigued. Has always had some, but feels more.    HTN: has been doing well. Doesn't think that not feeling well is associated, but hasn't checked.    Follows with Dr. Ouida Sills for gyn care. Last visit they did re-eval fibroids and she was told they didn't grow or had grown very little.   Past Medical History:  Diagnosis Date   Anemia    Headache(784.0)    History of migraine    Hypertension    Past Surgical History:  Procedure Laterality Date   APPENDECTOMY     MYOMECHOMY     MYOMECTOMY VAGINAL APPROACH  2008   Allergies  Allergen Reactions   Amlodipine     fatigue   Current Meds  Medication Sig   Blood Pressure Monitoring (BLOOD PRESSURE CUFF) MISC 1 Device by Does not apply route daily.   desogestrel-ethinyl estradiol (ISIBLOOM) 0.15-30 MG-MCG tablet Take 1 tablet by mouth daily.    lisinopril-hydrochlorothiazide (ZESTORETIC) 20-25 MG tablet TAKE 2 TABLETS  BY MOUTH AT BEDTIME   Multiple Vitamin (MULTIVITAMIN) tablet Take 1 tablet by mouth daily.   potassium chloride SA (KLOR-CON) 20 MEQ tablet TAKE 1 TABLET(20 MEQ) BY MOUTH TWICE DAILY (Patient taking differently: Take 20 mEq by mouth daily.)   Social History   Tobacco Use   Smoking status: Never   Smokeless tobacco: Never  Substance Use Topics   Alcohol use: No   Family History  Problem Relation Age of Onset   Hypertension Father    Diabetes Father    Kidney disease Father    Healthy Sister    Cancer Maternal Grandmother        colon    Colon cancer Maternal Grandmother    Thyroid disease Mother    Breast cancer Mother 74   Cancer Maternal Grandfather        colon   Pancreatic cancer Maternal Uncle    Esophageal cancer Neg Hx    Rectal cancer Neg Hx    Stomach cancer Neg Hx      Review of Systems  Constitutional:  Positive for fatigue. Negative for activity change, appetite change, chills, fever and unexpected weight change.  HENT:  Negative for congestion, ear pain, hearing loss, sinus pressure, sinus pain, sore throat and trouble swallowing.   Eyes:  Negative for pain and visual disturbance.  Respiratory:  Negative for cough, chest tightness, shortness of breath and wheezing.   Cardiovascular:  Negative for chest pain, palpitations and leg swelling.  Gastrointestinal:  Positive for abdominal pain, constipation (chronic) and nausea. Negative for blood in stool, diarrhea and vomiting.  Genitourinary:  Negative for difficulty urinating and menstrual problem.  Musculoskeletal:  Negative for arthralgias and back pain.  Skin:  Negative for rash.  Neurological:  Negative for dizziness, weakness, numbness and headaches.  Hematological:  Negative for adenopathy. Does not bruise/bleed easily.  Psychiatric/Behavioral:  Negative for sleep disturbance and suicidal ideas. The patient is not nervous/anxious.    CBC:  Lab Results  Component Value Date   WBC 10.7 (H) 05/29/2020    HGB 11.6 (L) 05/29/2020   HGB 11.8 (L) 02/23/2020   HCT 35.2 (L) 05/29/2020   MCH 23.4 (L) 05/29/2020   MCHC 33.0 05/29/2020   RDW 13.2 05/29/2020   PLT 425 (H) 05/29/2020   PLT 281 02/23/2020   MPV 10.0 05/06/2013   CMP: Lab Results  Component Value Date   NA 132 (L) 05/29/2020   NA 138 05/03/2014   K 3.2 (L) 05/29/2020   CL 95 (L) 05/29/2020   CO2 21 (L) 05/29/2020   ANIONGAP 16 (H) 05/29/2020   GLUCOSE 164 (H) 05/29/2020   BUN 7 05/29/2020   BUN 15 05/03/2014   CREATININE 1.20 (H) 05/29/2020   CREATININE 1.32 (H) 04/12/2020   GFRAA >60 02/23/2020   CALCIUM 9.2 05/29/2020   PROT 7.6 05/29/2020   BILITOT 0.6 05/29/2020   BILITOT 0.4 02/23/2020   ALKPHOS 57 05/29/2020   ALT 13 05/29/2020   ALT 12 02/23/2020   AST 19 05/29/2020   AST 19 02/23/2020   LIPID: Lab Results  Component Value Date   CHOL 157 04/12/2020   TRIG 119 04/12/2020   HDL 60 04/12/2020   LDLCALC 76 04/12/2020    Objective:  BP 108/78 (BP Location: Left Arm, Patient Position: Sitting, Cuff Size: Large)   Pulse 98   Temp 98 F (36.7 C) (Oral)   Ht '5\' 6"'$  (1.676 m)   Wt 186 lb 12.8 oz (84.7 kg)   LMP 03/21/2021 (Exact Date)   SpO2 96%   BMI 30.15 kg/m   Weight: 186 lb 12.8 oz (84.7 kg)   BP Readings from Last 3 Encounters:  04/14/21 108/78  05/29/20 133/84  04/29/20 110/80   Wt Readings from Last 3 Encounters:  04/14/21 186 lb 12.8 oz (84.7 kg)  04/29/20 182 lb 14.4 oz (83 kg)  04/12/20 183 lb 12.8 oz (83.4 kg)    Physical Exam Constitutional:      General: She is not in acute distress.    Appearance: She is well-developed.  HENT:     Head: Normocephalic and atraumatic.     Right Ear: External ear normal.     Left Ear: External ear normal.     Mouth/Throat:     Pharynx: No oropharyngeal exudate.  Eyes:     Conjunctiva/sclera: Conjunctivae normal.     Pupils: Pupils are equal, round, and reactive to light.  Neck:     Thyroid: No thyromegaly.  Cardiovascular:     Rate and  Rhythm: Normal rate and regular rhythm.     Heart sounds: Normal heart sounds. No murmur heard.   No friction rub. No gallop.  Pulmonary:     Effort: Pulmonary effort is normal.     Breath sounds: Normal breath sounds.  Abdominal:     General: Abdomen is flat. Bowel sounds are normal. There is no distension.     Palpations: Abdomen is soft. There is no mass.  Tenderness: There is abdominal tenderness in the right lower quadrant, epigastric area and suprapubic area. There is no guarding.     Hernia: No hernia is present.  Musculoskeletal:        General: No tenderness or deformity. Normal range of motion.     Cervical back: Normal range of motion and neck supple.  Lymphadenopathy:     Cervical: No cervical adenopathy.  Skin:    General: Skin is warm and dry.     Findings: No rash.  Neurological:     Mental Status: She is alert and oriented to person, place, and time.     Deep Tendon Reflexes: Reflexes normal.     Reflex Scores:      Tricep reflexes are 2+ on the right side and 2+ on the left side.      Bicep reflexes are 2+ on the right side and 2+ on the left side.      Brachioradialis reflexes are 2+ on the right side and 2+ on the left side.      Patellar reflexes are 2+ on the right side and 2+ on the left side. Psychiatric:        Speech: Speech normal.        Behavior: Behavior normal.        Thought Content: Thought content normal.    Assessment/Plan: Health Maintenance Due  Topic Date Due   INFLUENZA VACCINE  03/03/2021   Health Maintenance reviewed.  1. Preventative health care She wishes to hold off on the flu shot; postpone shingles vaccine.   2. Iron deficiency anemia due to chronic blood loss - IBC + Ferritin; Future  3. Primary hypertension Well controlled with current medication. Prior sleep apnea testing negative.  - CBC with Differential/Platelet; Future - Comprehensive metabolic panel; Future  4. Dizziness Newer sx; start with bloodwork and  consider further evaluation pending these results.  - Vitamin B12; Future - Magnesium, RBC; Future - TSH; Future  5. Abdominal pain, unspecified abdominal location - CBC with Differential/Platelet; Future - Comprehensive metabolic panel; Future - Lipase; Future - Urinalysis with Culture Reflex  6. Lipid screening - Lipid panel; Future  Return for pending results.  Micheline Rough, MD

## 2021-04-16 LAB — URINALYSIS W MICROSCOPIC + REFLEX CULTURE
Bacteria, UA: NONE SEEN /HPF
Bilirubin Urine: NEGATIVE
Glucose, UA: NEGATIVE
Hgb urine dipstick: NEGATIVE
Hyaline Cast: NONE SEEN /LPF
Ketones, ur: NEGATIVE
Leukocyte Esterase: NEGATIVE
Nitrites, Initial: NEGATIVE
Specific Gravity, Urine: 1.021 (ref 1.001–1.035)
Squamous Epithelial / HPF: NONE SEEN /HPF (ref <=5)
WBC, UA: NONE SEEN /HPF (ref 0–5)
pH: 6 (ref 5.0–8.0)

## 2021-04-16 LAB — MAGNESIUM, RBC: Magnesium RBC: 6.1 mg/dL (ref 4.0–6.4)

## 2021-04-16 LAB — NO CULTURE INDICATED

## 2021-04-23 ENCOUNTER — Encounter: Payer: Self-pay | Admitting: Family Medicine

## 2021-04-23 DIAGNOSIS — D568 Other thalassemias: Secondary | ICD-10-CM | POA: Insufficient documentation

## 2021-07-14 ENCOUNTER — Other Ambulatory Visit: Payer: Self-pay | Admitting: Obstetrics

## 2021-07-14 DIAGNOSIS — Z1231 Encounter for screening mammogram for malignant neoplasm of breast: Secondary | ICD-10-CM

## 2021-07-20 ENCOUNTER — Other Ambulatory Visit: Payer: Self-pay | Admitting: Family Medicine

## 2021-07-20 DIAGNOSIS — I10 Essential (primary) hypertension: Secondary | ICD-10-CM

## 2021-07-23 ENCOUNTER — Ambulatory Visit
Admission: RE | Admit: 2021-07-23 | Discharge: 2021-07-23 | Disposition: A | Discharge status: Home / Self Care | Payer: BC Managed Care – PPO | Source: Ambulatory Visit | Attending: Obstetrics | Admitting: Obstetrics

## 2021-07-23 DIAGNOSIS — Z1231 Encounter for screening mammogram for malignant neoplasm of breast: Secondary | ICD-10-CM

## 2021-08-14 ENCOUNTER — Ambulatory Visit: Payer: BC Managed Care – PPO

## 2021-11-05 ENCOUNTER — Ambulatory Visit: Payer: BC Managed Care – PPO | Admitting: Family Medicine

## 2021-11-05 ENCOUNTER — Encounter: Payer: Self-pay | Admitting: Family Medicine

## 2021-11-05 VITALS — BP 126/88 | HR 88 | Temp 99.2°F | Wt 192.0 lb

## 2021-11-05 DIAGNOSIS — H00015 Hordeolum externum left lower eyelid: Secondary | ICD-10-CM | POA: Diagnosis not present

## 2021-11-05 NOTE — Progress Notes (Signed)
? ?  Subjective:  ? ? Patient ID: Jean Bond, female    DOB: 1971/04/26, 51 y.o.   MRN: [id number] ? ?HPI ?Here for a stye in the left upper lid that began about 3 days ago. This was painful and I made her left upper lid swell. The eye itself was fine with no redness or discomfort. Over the next 24 hours she developed a swelling and a painful lump under the skin just anterior to the left ear. The entire left side of her face became tender, but there was no redness. No fever or sinus pressure or PND or ST. Today the facial tenderness has reduced and the stye is much smaller. She has been applying warm compresses to the left eye and the left side of her face.  ? ? ?Review of Systems  ?Constitutional: Negative.   ?HENT:  Positive for facial swelling. Negative for congestion, ear pain, postnasal drip, sinus pressure and sore throat.   ?Eyes: Negative.   ?Respiratory: Negative.    ? ?   ?Objective:  ? Physical Exam ?Constitutional:   ?   General: She is not in acute distress. ?   Appearance: Normal appearance.  ?HENT:  ?   Right Ear: Tympanic membrane, ear canal and external ear normal.  ?   Left Ear: Tympanic membrane, ear canal and external ear normal.  ?   Ears:  ?   Comments: There is an enlarged tender lymph node just anterior to the left ear. She is also tender beneath the left jaw angle. The skin on her face is clear  ?   Nose: Nose normal.  ?   Mouth/Throat:  ?   Pharynx: Oropharynx is clear.  ?Eyes:  ?   Conjunctiva/sclera: Conjunctivae normal.  ?   Pupils: Pupils are equal, round, and reactive to light.  ?   Comments: The left upper lid is mildly swollen and mildly tender. There is a tiny lump in the left lid at the eyelash line   ?Pulmonary:  ?   Effort: Pulmonary effort is normal.  ?   Breath sounds: Normal breath sounds.  ?Neurological:  ?   Mental Status: She is alert.  ? ? ? ? ? ?   ?Assessment & Plan:  ?She has a stye in the left upper lid which is resolving on its own. She also has some reactive  lymph nodes in the left preauricular and left submandibular areas. All this is resolving. She can take Ibuprofen as needed for discomfort.  ?Alysia Penna, MD ? ? ?

## 2022-04-02 ENCOUNTER — Encounter: Payer: Self-pay | Admitting: Family Medicine

## 2022-04-02 ENCOUNTER — Ambulatory Visit: Payer: BC Managed Care – PPO | Admitting: Family Medicine

## 2022-04-02 VITALS — BP 116/82 | HR 94 | Temp 98.3°F | Ht 66.0 in | Wt 191.6 lb

## 2022-04-02 DIAGNOSIS — K5909 Other constipation: Secondary | ICD-10-CM

## 2022-04-02 DIAGNOSIS — N1831 Chronic kidney disease, stage 3a: Secondary | ICD-10-CM

## 2022-04-02 DIAGNOSIS — D500 Iron deficiency anemia secondary to blood loss (chronic): Secondary | ICD-10-CM | POA: Diagnosis not present

## 2022-04-02 DIAGNOSIS — K5900 Constipation, unspecified: Secondary | ICD-10-CM | POA: Insufficient documentation

## 2022-04-02 DIAGNOSIS — I10 Essential (primary) hypertension: Secondary | ICD-10-CM

## 2022-04-02 DIAGNOSIS — R0982 Postnasal drip: Secondary | ICD-10-CM | POA: Diagnosis not present

## 2022-04-02 MED ORDER — LISINOPRIL-HYDROCHLOROTHIAZIDE 20-25 MG PO TABS: 1.0000 | ORAL_TABLET | Freq: Every day | ORAL | 1 refills | Status: DC

## 2022-04-02 MED ORDER — FLUTICASONE PROPIONATE 50 MCG/ACT NA SUSP: 2.0000 | Freq: Every day | NASAL | 2 refills | Status: DC

## 2022-04-02 NOTE — Patient Instructions (Signed)
Increase Water Intake  Add more fiber  Add Probiotics

## 2022-04-02 NOTE — Assessment & Plan Note (Signed)
BP well controlled on the medication listed below, will continue this. She is also due for her annual lab work, will order this. Current hypertension medications:      Sig   lisinopril-hydrochlorothiazide (ZESTORETIC) 20-25 MG tablet Take 1 tablet by mouth at bedtime.

## 2022-04-02 NOTE — Assessment & Plan Note (Signed)
Needs new CMP today for surveillance.

## 2022-04-02 NOTE — Assessment & Plan Note (Signed)
Likely causing the abdominal discomfort, I recommended increasing fluids, fiber, and trying OTC probiotics to help with this problem. Patient instructed to call me in about 1-2 months if her constipation is not improving.

## 2022-04-02 NOTE — Progress Notes (Signed)
Established Patient Office Visit  Subjective   Patient ID: Jean Bond, female    DOB: Nov 07, 1970  Age: 51 y.o. MRN: [id number]  Chief Complaint  Patient presents with   Establish Care    Is here for transition of care visit. Patient is reporting a new symptom of "ears feeling full" and feels like she is getting dizzy from it. No pain really, no pressure in the ear, no hearing loss.  Also is reporting she is waking up in the middle of the night with stomach cramps, states that this has been going on for the past week. Feels like maybe she has been more constipated. No nausea or vomiting, no fever/chills, states she is still going to the BR once a day, however she admits that the stool is very hard.  Patient is reporting that her knee are "noisy" she does not have pain most days, but occasionally it will be painful.   HTN -- BP in office performed and is well controlled. She reports no side effects to the medications, no chest pain, SOB, dizziness or headaches. She has a BP cuff at home and is checking her BP regularly, reports they are in the normal range.     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      Objective:     BP 116/82 (BP Location: Left Arm, Patient Position: Sitting, Cuff Size: Large)   Pulse 94   Temp 98.3 F (36.8 C) (Oral)   Ht '5\' 6"'$  (1.676 m)   Wt 191 lb 9.6 oz (86.9 kg)   LMP 03/12/2022 (Approximate)   SpO2 96%   BMI 30.93 kg/m  BP Readings from Last 3 Encounters:  04/02/22 116/82  11/05/21 126/88  04/14/21 108/78      Physical Exam Vitals reviewed.  Constitutional:      Appearance: Normal appearance. She is well-groomed and normal weight.  HENT:     Head: Normocephalic and atraumatic.     Right Ear: Tympanic membrane and ear canal normal.     Left Ear: Tympanic membrane and ear canal normal.  Eyes:     Conjunctiva/sclera: Conjunctivae normal.  Pulmonary:     Breath sounds: Normal air entry.  Musculoskeletal:        General: Normal range of motion.     Right lower leg: No edema.     Left lower leg: No edema.  Skin:    General: Skin is warm and dry.  Neurological:     Mental Status: She is alert and oriented to person, place, and time. Mental status is at baseline.     Gait: Gait is intact.  Psychiatric:        Mood and Affect: Mood and affect normal.  Speech: Speech normal.        Behavior: Behavior normal.        Judgment: Judgment normal.      No results found for any visits on 04/02/22.  Last metabolic panel Lab Results  Component Value Date   GLUCOSE 96 04/14/2021   NA 137 04/14/2021   K 3.7 04/14/2021   CL 100 04/14/2021   CO2 25 04/14/2021   BUN 12 04/14/2021   CREATININE 1.34 (H) 04/14/2021   GFRNONAA 55 (L) 05/29/2020   CALCIUM 9.1 04/14/2021   PROT 7.3 04/14/2021   ALBUMIN 4.1 04/14/2021   BILITOT 0.5 04/14/2021   ALKPHOS 56 04/14/2021   AST 12 04/14/2021   ALT 10 04/14/2021   ANIONGAP 16 (H) 05/29/2020      The 10-year ASCVD risk score (Arnett DK, et al., 2019) is: 2%    Assessment & Plan:   Problem List Items Addressed This Visit       Cardiovascular and Mediastinum   Hypertension - Primary    BP  well controlled on the medication listed below, will continue this. She is also due for her annual lab work, will order this. Current hypertension medications:       Sig   lisinopril-hydrochlorothiazide (ZESTORETIC) 20-25 MG tablet Take 1 tablet by mouth at bedtime.            Relevant Medications   lisinopril-hydrochlorothiazide (ZESTORETIC) 20-25 MG tablet   Other Relevant Orders   CMP   Lipid Panel     Genitourinary   CKD (chronic kidney disease), stage III ([place])    Needs new CMP today for surveillance.       Relevant Orders   CMP     Other   Iron deficiency anemia    Last Hb was 11.7, pt denies any symptoms of anemia at this time, will order her annual labs including iron studies today.       Relevant Orders   CBC with Differential/Platelets   Iron and TIBC   Post-nasal drip    Ears are clear on exam, we discussed increasing insufflation of her ears to improve drainage. I gave her a trial of flonase to help with the possible middle ear effusions and with the post nasal drip.       Relevant Medications   fluticasone (FLONASE) 50 MCG/ACT nasal spray   Constipation    Likely causing the abdominal discomfort, I recommended increasing fluids, fiber, and trying OTC probiotics to help with this problem. Patient instructed to call me in about 1-2 months if her constipation is not improving.       Other Visit Diagnoses     Essential hypertension       Relevant Medications   lisinopril-hydrochlorothiazide (ZESTORETIC) 20-25 MG tablet       Return in about 1 year (around 04/03/2023) for Annual health physical.    Farrel Conners, MD

## 2022-04-02 NOTE — Assessment & Plan Note (Signed)
Ears are clear on exam, we discussed increasing insufflation of her ears to improve drainage. I gave her a trial of flonase to help with the possible middle ear effusions and with the post nasal drip.

## 2022-04-02 NOTE — Assessment & Plan Note (Signed)
Last Hb was 11.7, pt denies any symptoms of anemia at this time, will order her annual labs including iron studies today.

## 2022-04-16 ENCOUNTER — Other Ambulatory Visit (INDEPENDENT_AMBULATORY_CARE_PROVIDER_SITE_OTHER): Payer: BC Managed Care – PPO

## 2022-04-16 DIAGNOSIS — I10 Essential (primary) hypertension: Secondary | ICD-10-CM | POA: Diagnosis not present

## 2022-04-16 DIAGNOSIS — D500 Iron deficiency anemia secondary to blood loss (chronic): Secondary | ICD-10-CM | POA: Diagnosis not present

## 2022-04-16 DIAGNOSIS — N1831 Chronic kidney disease, stage 3a: Secondary | ICD-10-CM

## 2022-04-16 DIAGNOSIS — E876 Hypokalemia: Secondary | ICD-10-CM

## 2022-04-16 LAB — COMPREHENSIVE METABOLIC PANEL
ALT: 15 U/L (ref 0–35)
AST: 21 U/L (ref 0–37)
Albumin: 4.2 g/dL (ref 3.5–5.2)
Alkaline Phosphatase: 68 U/L (ref 39–117)
BUN: 15 mg/dL (ref 6–23)
CO2: 25 mEq/L (ref 19–32)
Calcium: 9.8 mg/dL (ref 8.4–10.5)
Chloride: 100 mEq/L (ref 96–112)
Creatinine, Ser: 1.36 mg/dL — ABNORMAL HIGH (ref 0.40–1.20)
GFR: 44.63 mL/min — ABNORMAL LOW (ref >60.00)
Glucose, Bld: 95 mg/dL (ref 70–99)
Potassium: 3 mEq/L — ABNORMAL LOW (ref 3.5–5.1)
Sodium: 135 mEq/L (ref 135–145)
Total Bilirubin: 0.6 mg/dL (ref 0.2–1.2)
Total Protein: 8 g/dL (ref 6.0–8.3)

## 2022-04-16 LAB — CBC WITH DIFFERENTIAL/PLATELET
Basophils Absolute: 0.1 10*3/uL (ref 0.0–0.1)
Basophils Relative: 0.9 % (ref 0.0–3.0)
Eosinophils Absolute: 0.4 10*3/uL (ref 0.0–0.7)
Eosinophils Relative: 4.1 % (ref 0.0–5.0)
HCT: 36.8 % (ref 36.0–46.0)
Hemoglobin: 11.8 g/dL — ABNORMAL LOW (ref 12.0–15.0)
Lymphocytes Relative: 30.3 % (ref 12.0–46.0)
Lymphs Abs: 2.9 10*3/uL (ref 0.7–4.0)
MCHC: 32.1 g/dL (ref 30.0–36.0)
MCV: 70.1 fl — ABNORMAL LOW (ref 78.0–100.0)
Monocytes Absolute: 0.3 10*3/uL (ref 0.1–1.0)
Monocytes Relative: 3.4 % (ref 3.0–12.0)
Neutro Abs: 5.8 10*3/uL (ref 1.4–7.7)
Neutrophils Relative %: 61.3 % (ref 43.0–77.0)
Platelets: 311 10*3/uL (ref 150.0–400.0)
RBC: 5.25 Mil/uL — ABNORMAL HIGH (ref 3.87–5.11)
RDW: 13.9 % (ref 11.5–15.5)
WBC: 9.5 10*3/uL (ref 4.0–10.5)

## 2022-04-16 LAB — LIPID PANEL
Cholesterol: 157 mg/dL (ref 0–200)
HDL: 39.5 mg/dL (ref >39.00)
LDL Cholesterol: 102 mg/dL — ABNORMAL HIGH (ref 0–99)
NonHDL: 117.37
Total CHOL/HDL Ratio: 4
Triglycerides: 79 mg/dL (ref 0.0–149.0)
VLDL: 15.8 mg/dL (ref 0.0–40.0)

## 2022-04-17 LAB — IRON AND TIBC
Iron Saturation: 17 % (ref 15–55)
Iron: 51 ug/dL (ref 27–159)
Total Iron Binding Capacity: 301 ug/dL (ref 250–450)
UIBC: 250 ug/dL (ref 131–425)

## 2022-04-30 MED ORDER — POTASSIUM CHLORIDE CRYS ER 20 MEQ PO TBCR: 20.0000 meq | EXTENDED_RELEASE_TABLET | Freq: Every day | ORAL | 1 refills | Status: DC

## 2022-05-05 ENCOUNTER — Encounter (HOSPITAL_COMMUNITY): Payer: Self-pay

## 2022-05-05 ENCOUNTER — Ambulatory Visit (HOSPITAL_COMMUNITY)
Admission: EM | Admit: 2022-05-05 | Discharge: 2022-05-05 | Disposition: A | Discharge status: Home / Self Care | Payer: BC Managed Care – PPO | Attending: Family Medicine | Admitting: Family Medicine

## 2022-05-05 ENCOUNTER — Encounter: Payer: Self-pay | Admitting: Hematology and Oncology

## 2022-05-05 DIAGNOSIS — H8110 Benign paroxysmal vertigo, unspecified ear: Secondary | ICD-10-CM | POA: Diagnosis not present

## 2022-05-05 DIAGNOSIS — R110 Nausea: Secondary | ICD-10-CM | POA: Insufficient documentation

## 2022-05-05 DIAGNOSIS — Z20822 Contact with and (suspected) exposure to covid-19: Secondary | ICD-10-CM | POA: Insufficient documentation

## 2022-05-05 DIAGNOSIS — R1084 Generalized abdominal pain: Secondary | ICD-10-CM | POA: Diagnosis present

## 2022-05-05 LAB — COMPREHENSIVE METABOLIC PANEL
ALT: 20 U/L (ref 0–44)
AST: 26 U/L (ref 15–41)
Albumin: 4.3 g/dL (ref 3.5–5.0)
Alkaline Phosphatase: 64 U/L (ref 38–126)
Anion gap: 11 (ref 5–15)
BUN: 9 mg/dL (ref 6–20)
CO2: 25 mmol/L (ref 22–32)
Calcium: 9.2 mg/dL (ref 8.9–10.3)
Chloride: 100 mmol/L (ref 98–111)
Creatinine, Ser: 1.37 mg/dL — ABNORMAL HIGH (ref 0.44–1.00)
GFR, Estimated: 46 mL/min — ABNORMAL LOW (ref >60)
Glucose, Bld: 107 mg/dL — ABNORMAL HIGH (ref 70–99)
Potassium: 3.5 mmol/L (ref 3.5–5.1)
Sodium: 136 mmol/L (ref 135–145)
Total Bilirubin: 0.5 mg/dL (ref 0.3–1.2)
Total Protein: 7.9 g/dL (ref 6.5–8.1)

## 2022-05-05 LAB — CBC WITH DIFFERENTIAL/PLATELET
Abs Immature Granulocytes: 0.05 10*3/uL (ref 0.00–0.07)
Basophils Absolute: 0 10*3/uL (ref 0.0–0.1)
Basophils Relative: 0 %
Eosinophils Absolute: 0 10*3/uL (ref 0.0–0.5)
Eosinophils Relative: 0 %
HCT: 36.3 % (ref 36.0–46.0)
Hemoglobin: 11.7 g/dL — ABNORMAL LOW (ref 12.0–15.0)
Immature Granulocytes: 0 %
Lymphocytes Relative: 8 %
Lymphs Abs: 0.9 10*3/uL (ref 0.7–4.0)
MCH: 22.5 pg — ABNORMAL LOW (ref 26.0–34.0)
MCHC: 32.2 g/dL (ref 30.0–36.0)
MCV: 69.7 fL — ABNORMAL LOW (ref 80.0–100.0)
Monocytes Absolute: 0.2 10*3/uL (ref 0.1–1.0)
Monocytes Relative: 2 %
Neutro Abs: 10 10*3/uL — ABNORMAL HIGH (ref 1.7–7.7)
Neutrophils Relative %: 90 %
Platelets: 348 10*3/uL (ref 150–400)
RBC: 5.21 MIL/uL — ABNORMAL HIGH (ref 3.87–5.11)
RDW: 13.4 % (ref 11.5–15.5)
WBC: 11.2 10*3/uL — ABNORMAL HIGH (ref 4.0–10.5)
nRBC: 0 % (ref 0.0–0.2)

## 2022-05-05 LAB — POCT URINALYSIS DIPSTICK, ED / UC
Bilirubin Urine: NEGATIVE
Glucose, UA: NEGATIVE mg/dL
Ketones, ur: NEGATIVE mg/dL
Leukocytes,Ua: NEGATIVE
Nitrite: NEGATIVE
Protein, ur: NEGATIVE mg/dL
Specific Gravity, Urine: 1.025 (ref 1.005–1.030)
Urobilinogen, UA: 0.2 mg/dL (ref 0.0–1.0)
pH: 5.5 (ref 5.0–8.0)

## 2022-05-05 LAB — POC URINE PREG, ED: Preg Test, Ur: NEGATIVE

## 2022-05-05 LAB — LIPASE, BLOOD: Lipase: 30 U/L (ref 11–51)

## 2022-05-05 MED ORDER — MECLIZINE HCL 25 MG PO TABS: 25.0000 mg | ORAL_TABLET | Freq: Three times a day (TID) | ORAL | 0 refills | Status: DC | PRN

## 2022-05-05 MED ORDER — ONDANSETRON 4 MG PO TBDP: 4.0000 mg | ORAL_TABLET | Freq: Three times a day (TID) | ORAL | 0 refills | Status: DC | PRN

## 2022-05-05 NOTE — ED Triage Notes (Signed)
Pt c/o rt center abd pain with headaches and dizziness since yesterday evening and this morning. Took tylenol this morning with relief.

## 2022-05-05 NOTE — Discharge Instructions (Signed)
You have been seen today for abdominal pain. Your evaluation was not suggestive of any emergent condition requiring medical intervention at this time. However, some abdominal problems make take more time to appear. Therefore, it is very important for you to pay attention to any new symptoms or worsening of your current condition.  Please return here or to the Emergency Department immediately should you begin to feel worse in any way or have any of the following symptoms: increasing or different abdominal pain, persistent vomiting, inability to drink fluids, fevers, or shaking chills.   You have had labs (blood work) drawn today. We will call you with any significant abnormalities or if there is need to begin or change treatment or pursue further follow up.  You may also review your test results online through [facility]. If you do not have a MyChart account, instructions to sign up should be on your discharge paperwork.

## 2022-05-06 LAB — SARS CORONAVIRUS 2 (TAT 6-24 HRS): SARS Coronavirus 2: NEGATIVE

## 2022-05-07 NOTE — ED Provider Notes (Signed)
[place]   [id number] 05/05/22 Arrival Time: 4970  ASSESSMENT & PLAN:  1. Generalized abdominal pain   2. Benign paroxysmal positional vertigo, unspecified laterality    Benign abdominal exam. No indications for urgent abdominal/pelvic imaging at this time. Is comfortable with home observation. May eventually need ENT eval for recurrent vertigo. No gross neurological abnormalities.  Meds ordered this encounter  Medications   meclizine (ANTIVERT) 25 MG tablet    Sig: Take 1 tablet (25 mg total) by mouth 3 (three) times daily as needed for dizziness.    Dispense:  30 tablet    Refill:  0   ondansetron (ZOFRAN-ODT) 4 MG disintegrating tablet    Sig: Take 1 tablet (4 mg total) by mouth every 8 (eight) hours as needed for nausea or vomiting.    Dispense:  15 tablet    Refill:  0     Discharge Instructions      You have been seen today for abdominal pain. Your evaluation was not suggestive of any emergent condition requiring medical intervention at this time. However, some abdominal problems make take more time to appear. Therefore, it is very important for you to pay attention to any new symptoms or worsening of your current condition.  Please return here or to the Emergency Department immediately should you begin to feel worse in any way or have any of the following symptoms: increasing or different abdominal pain, persistent vomiting, inability to drink fluids, fevers, or shaking chills.   You have had labs (blood work) drawn today. We will call you with any significant abnormalities or if there is need to begin or change treatment or pursue further follow up.  You may also review your test results online through [place]. If you do not have a MyChart account, instructions to sign up should be on your discharge paperwork.     Follow-up Information     Schedule an appointment as soon as possible for a visit  with Farrel Conners, MD.   Specialty: Family  Medicine Contact information: [place] [address] [phone]         [facility] EMERGENCY DEPARTMENT.   Specialty: Emergency Medicine Why: If symptoms worsen in any way. Contact information: [address] [id number] [place] [phone]               Labs Reviewed  COMPREHENSIVE METABOLIC PANEL - Abnormal; Notable for the following components:      Result Value   Glucose, Bld 107 (*)    Creatinine, Ser 1.37 (*)    GFR, Estimated 46 (*)    All other components within normal limits  CBC WITH DIFFERENTIAL/PLATELET - Abnormal; Notable for the following components:   WBC 11.2 (*)    RBC 5.21 (*)    Hemoglobin 11.7 (*)    MCV 69.7 (*)    MCH 22.5 (*)    Neutro Abs 10.0 (*)    All other components within normal limits  POCT URINALYSIS DIPSTICK, ED / UC - Abnormal; Notable for the following components:   Hgb urine dipstick LARGE (*)    All other components within normal limits  SARS CORONAVIRUS 2 (TAT 6-24 HRS)  LIPASE, BLOOD  POC URINE PREG, ED   Cr at baseline. White count slightly elevated. She has agreed to ED eval should her symptoms worsen in any way.  Reviewed expectations re: course of current medical issues. Questions answered. Outlined signs and symptoms indicating need  for more acute intervention. Patient verbalized understanding. After Visit Summary given.   SUBJECTIVE: History from: patient. Jean Bond is a 51 y.o. female who presents with complaint of generalized non-radiating abd discomfort and vertigo; both with onset yesterday. Mild assoc HA. Tylenol has helped HA. Abd discomfort is poorly described and not limiting activities today. Afebrile. Normal PO intake without emesis or diarrhea. Mild nausea. Reports normal flatus. Symptoms are stable since beginning. Fever: absent. Aggravating factors: have not been identified. Alleviating factors: have not been  identified. Associated symptoms: vertigo as above. H/O vertigo similar to current; worse with head movements and when supine; better when sitting upright and still.  Ambulatory without assistance. Urinary symptoms: none. Bowel movements: have not significantly changed.  No LMP recorded. (Menstrual status: Irregular Periods).  Past Surgical History:  Procedure Laterality Date   APPENDECTOMY     MYOMECHOMY     MYOMECTOMY VAGINAL APPROACH  2008     OBJECTIVE:  Vitals:   05/05/22 1333  BP: (!) 136/92  Pulse: 80  Resp: 16  Temp: 98 F (36.7 C)  TempSrc: Oral  SpO2: 100%    General appearance: alert, oriented, no acute distress HEENT: [place]; AT; oropharynx moist Lungs: unlabored respirations Abdomen: soft; without distention; mild  and generalized discomfort reported with palpation of abdomen;normal bowel sounds; without masses or organomegaly; without guarding or rebound tenderness Back: without reported CVA tenderness; FROM at waist Extremities: without LE edema; symmetrical; without gross deformities Skin: warm and dry Neurologic: normal gait; CN 2-12 grossly intact; normal extrem strength and sensatioin Psychological: alert and cooperative; normal mood and affect  Labs: Results for orders placed or performed during the hospital encounter of 05/05/22  SARS CORONAVIRUS 2 (TAT 6-24 HRS) Anterior Nasal Swab   Specimen: Anterior Nasal Swab  Result Value Ref Range   SARS Coronavirus 2 NEGATIVE NEGATIVE  Comprehensive metabolic panel  Result Value Ref Range   Sodium 136 135 - 145 mmol/L   Potassium 3.5 3.5 - 5.1 mmol/L   Chloride 100 98 - 111 mmol/L   CO2 25 22 - 32 mmol/L   Glucose, Bld 107 (H) 70 - 99 mg/dL   BUN 9 6 - 20 mg/dL   Creatinine, Ser 1.37 (H) 0.44 - 1.00 mg/dL   Calcium 9.2 8.9 - 10.3 mg/dL   Total Protein 7.9 6.5 - 8.1 g/dL   Albumin 4.3 3.5 - 5.0 g/dL   AST 26 15 - 41 U/L   ALT 20 0 - 44 U/L   Alkaline Phosphatase 64 38 - 126 U/L   Total Bilirubin 0.5 0.3 -  1.2 mg/dL   GFR, Estimated 46 (L) >60 mL/min   Anion gap 11 5 - 15  Lipase, blood  Result Value Ref Range   Lipase 30 11 - 51 U/L  CBC with Differential/Platelet  Result Value Ref Range   WBC 11.2 (H) 4.0 - 10.5 K/uL   RBC 5.21 (H) 3.87 - 5.11 MIL/uL   Hemoglobin 11.7 (L) 12.0 - 15.0 g/dL   HCT 36.3 36.0 - 46.0 %   MCV 69.7 (L) 80.0 - 100.0 fL   MCH 22.5 (L) 26.0 - 34.0 pg   MCHC 32.2 30.0 - 36.0 g/dL   RDW 13.4 11.5 - 15.5 %   Platelets 348 150 - 400 K/uL   nRBC 0.0 0.0 - 0.2 %   Neutrophils Relative % 90 %   Neutro Abs 10.0 (H) 1.7 - 7.7 K/uL   Lymphocytes Relative 8 %   Lymphs Abs 0.9  0.7 - 4.0 K/uL   Monocytes Relative 2 %   Monocytes Absolute 0.2 0.1 - 1.0 K/uL   Eosinophils Relative 0 %   Eosinophils Absolute 0.0 0.0 - 0.5 K/uL   Basophils Relative 0 %   Basophils Absolute 0.0 0.0 - 0.1 K/uL   Immature Granulocytes 0 %   Abs Immature Granulocytes 0.05 0.00 - 0.07 K/uL  POC Urinalysis dipstick  Result Value Ref Range   Glucose, UA NEGATIVE NEGATIVE mg/dL   Bilirubin Urine NEGATIVE NEGATIVE   Ketones, ur NEGATIVE NEGATIVE mg/dL   Specific Gravity, Urine 1.025 1.005 - 1.030   Hgb urine dipstick LARGE (A) NEGATIVE   pH 5.5 5.0 - 8.0   Protein, ur NEGATIVE NEGATIVE mg/dL   Urobilinogen, UA 0.2 0.0 - 1.0 mg/dL   Nitrite NEGATIVE NEGATIVE   Leukocytes,Ua NEGATIVE NEGATIVE  POC urine pregnancy  Result Value Ref Range   Preg Test, Ur NEGATIVE NEGATIVE   Labs Reviewed  COMPREHENSIVE METABOLIC PANEL - Abnormal; Notable for the following components:      Result Value   Glucose, Bld 107 (*)    Creatinine, Ser 1.37 (*)    GFR, Estimated 46 (*)    All other components within normal limits  CBC WITH DIFFERENTIAL/PLATELET - Abnormal; Notable for the following components:   WBC 11.2 (*)    RBC 5.21 (*)    Hemoglobin 11.7 (*)    MCV 69.7 (*)    MCH 22.5 (*)    Neutro Abs 10.0 (*)    All other components within normal limits  POCT URINALYSIS DIPSTICK, ED / UC -  Abnormal; Notable for the following components:   Hgb urine dipstick LARGE (*)    All other components within normal limits  SARS CORONAVIRUS 2 (TAT 6-24 HRS)  LIPASE, BLOOD  POC URINE PREG, ED    Imaging: No results found.   Allergies  Allergen Reactions   Amlodipine     fatigue                                               Past Medical History:  Diagnosis Date   Anemia    Headache(784.0)    History of migraine    Hypertension     Social History   Socioeconomic History   Marital status: Married    Spouse name: Not on file   Number of children: Not on file   Years of education: Not on file   Highest education level: Not on file  Occupational History   Not on file  Tobacco Use   Smoking status: Never   Smokeless tobacco: Never  Vaping Use   Vaping Use: Never used  Substance and Sexual Activity   Alcohol use: No   Drug use: No   Sexual activity: Not on file  Other Topics Concern   Not on file  Social History Narrative   ** Merged History Encounter **       Social Determinants of Health   Financial Resource Strain: Not on file  Food Insecurity: Not on file  Transportation Needs: Not on file  Physical Activity: Not on file  Stress: Not on file  Social Connections: Not on file  Intimate Partner Violence: Not on file    Family History  Problem Relation Age of Onset   Hypertension Father    Diabetes Father    Kidney  disease Father    Healthy Sister    Cancer Maternal Grandmother        colon    Colon cancer Maternal Grandmother    Thyroid disease Mother    Breast cancer Mother 28   Cancer Maternal Grandfather        colon   Pancreatic cancer Maternal Uncle    Esophageal cancer Neg Hx    Rectal cancer Neg Hx    Stomach cancer Neg Hx      Vanessa Kick, MD 05/07/22 1601

## 2022-07-26 ENCOUNTER — Other Ambulatory Visit: Payer: Self-pay | Admitting: Family Medicine

## 2022-07-26 DIAGNOSIS — R0982 Postnasal drip: Secondary | ICD-10-CM

## 2022-08-05 IMAGING — MG MM DIGITAL SCREENING BILAT W/ TOMO AND CAD
8 series · 8 of 24 positions shown · non-contrast
Comparison: Previous exam(s).

CLINICAL DATA: Screening.

EXAM:
DIGITAL SCREENING BILATERAL MAMMOGRAM WITH TOMOSYNTHESIS AND CAD
TECHNIQUE: Bilateral screening digital craniocaudal and mediolateral oblique
mammograms were obtained. Bilateral screening digital breast
tomosynthesis was performed. The images were evaluated with
computer-aided detection.

[R CC synth-2D]
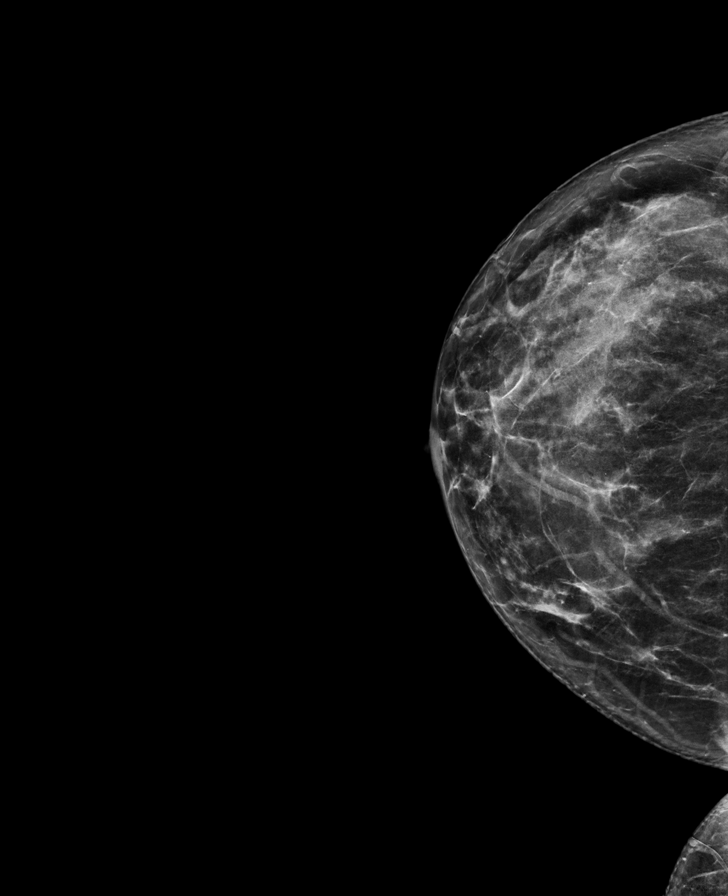

[R MLO synth-2D]
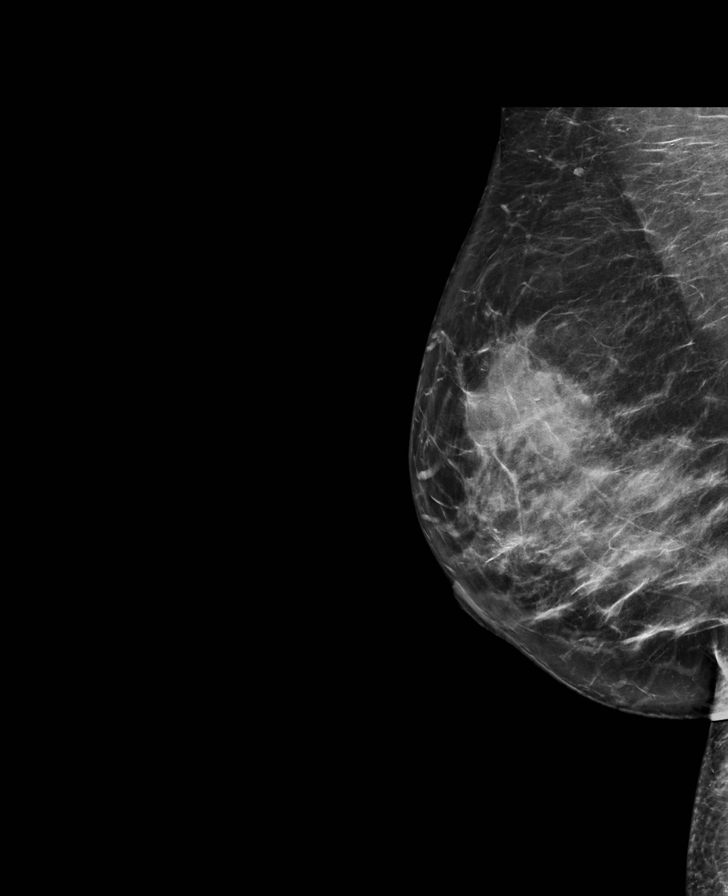

[L MLO synth-2D]
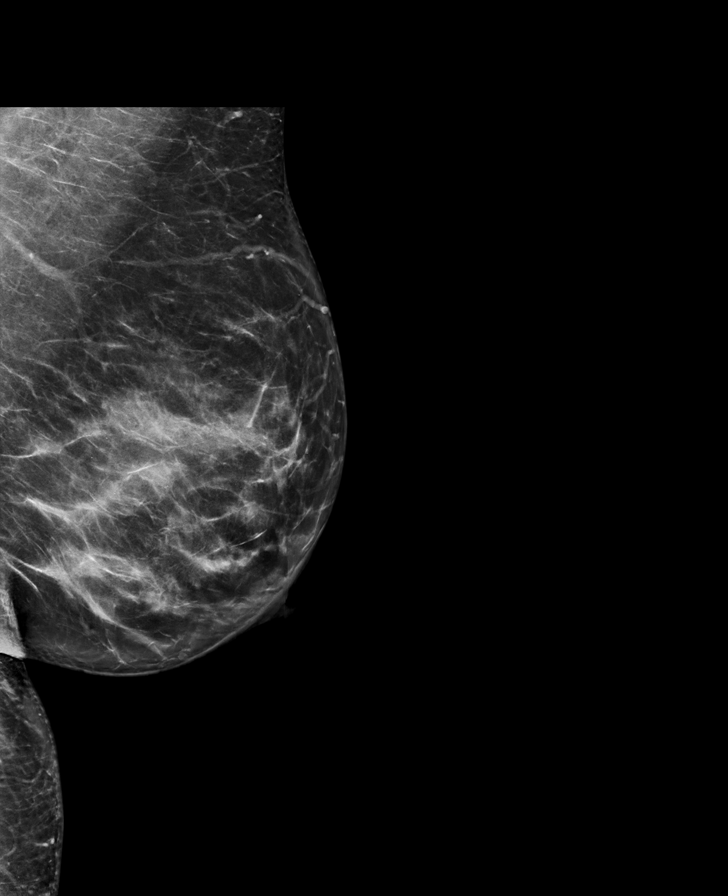

[L CC synth-2D]
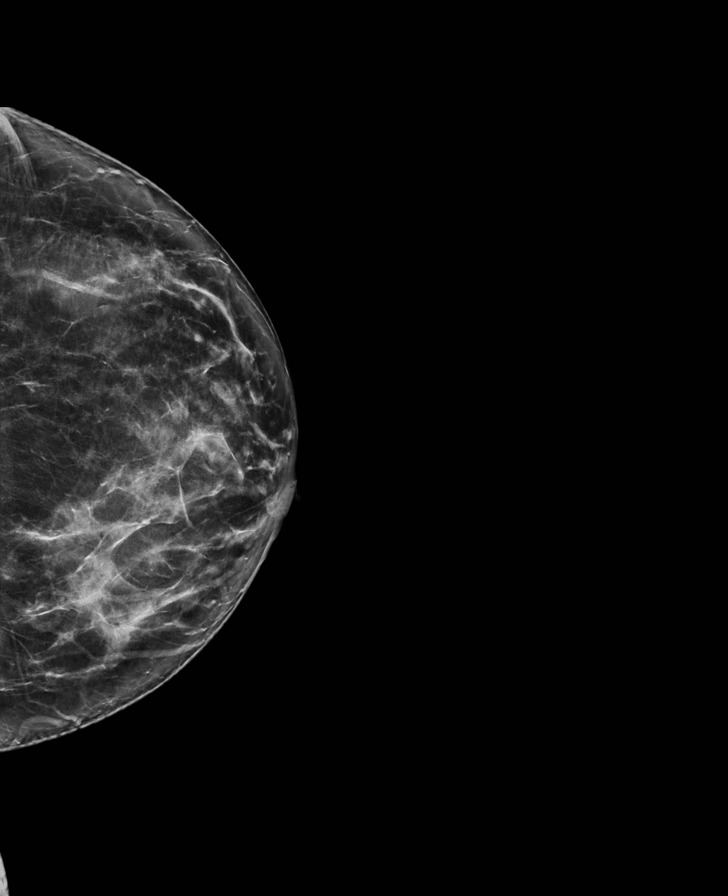

[R CC tomo · tomo slice 35/70.0]
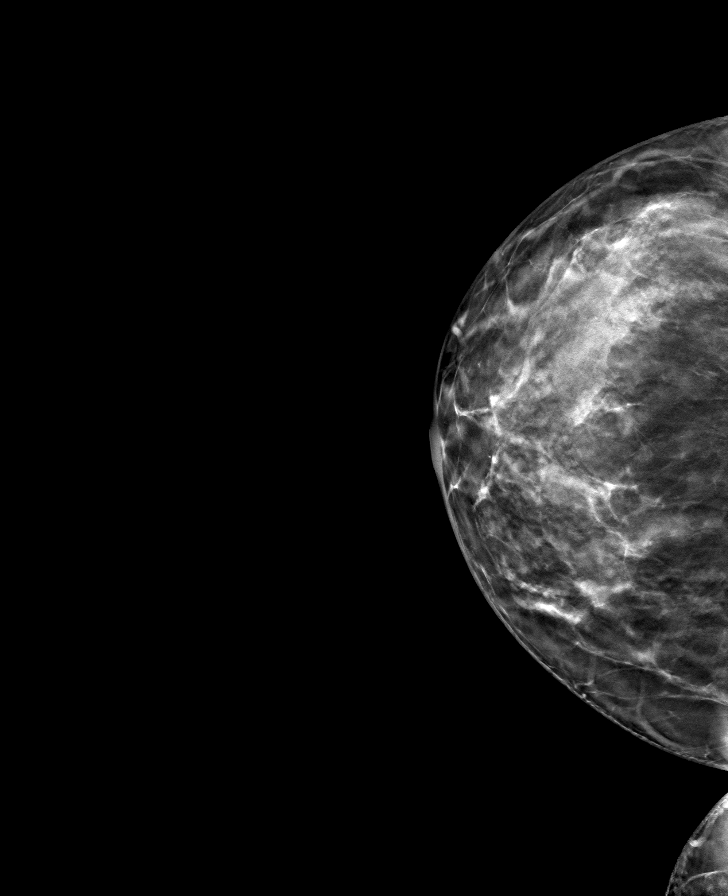

[R MLO tomo · tomo slice 41/80.0]
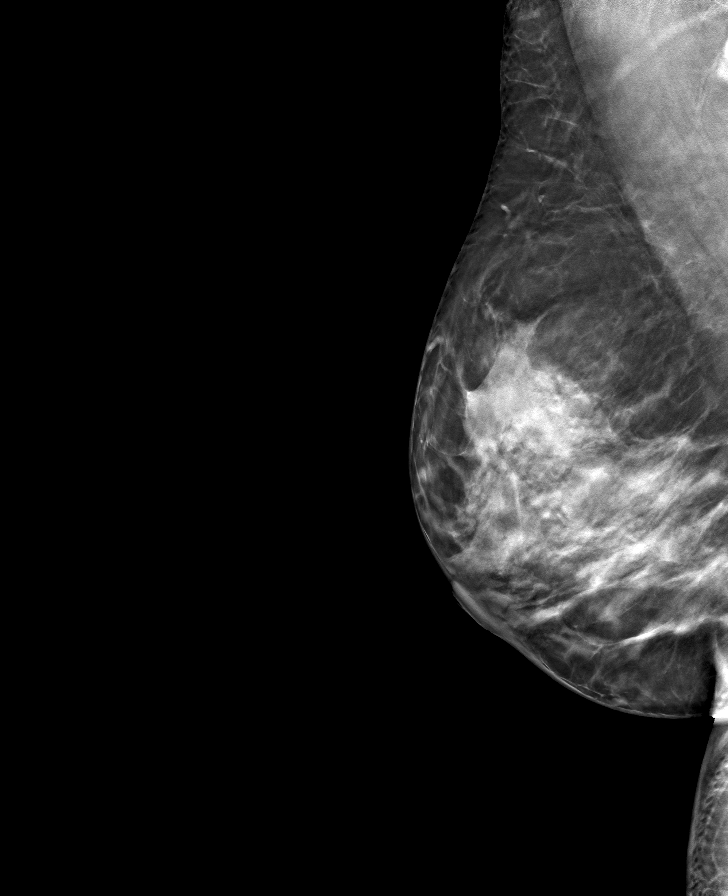

[L CC tomo · tomo slice 37/74.0]
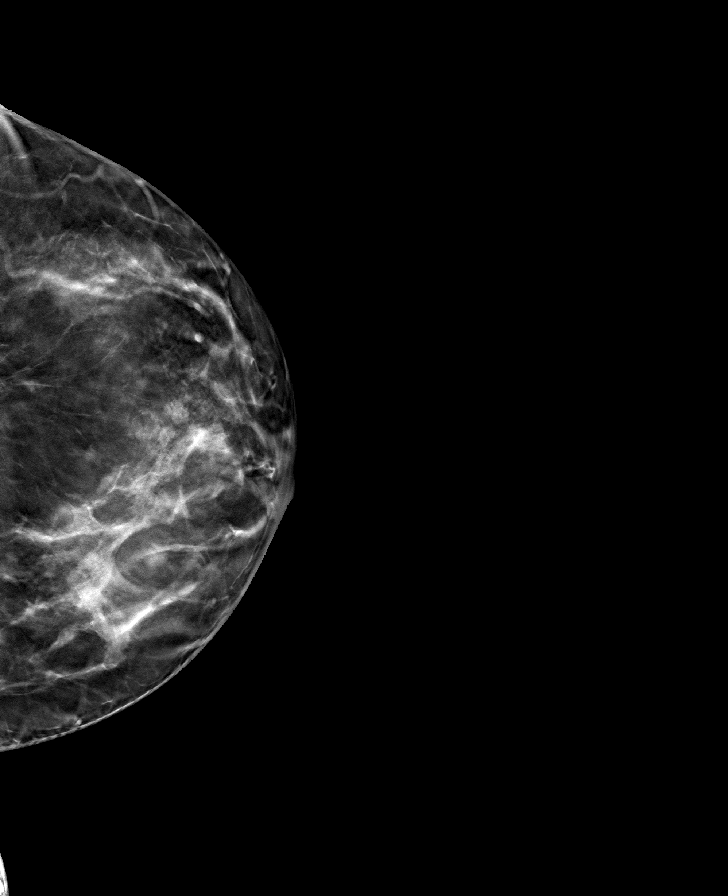

[L MLO tomo · tomo slice 41/82.0]
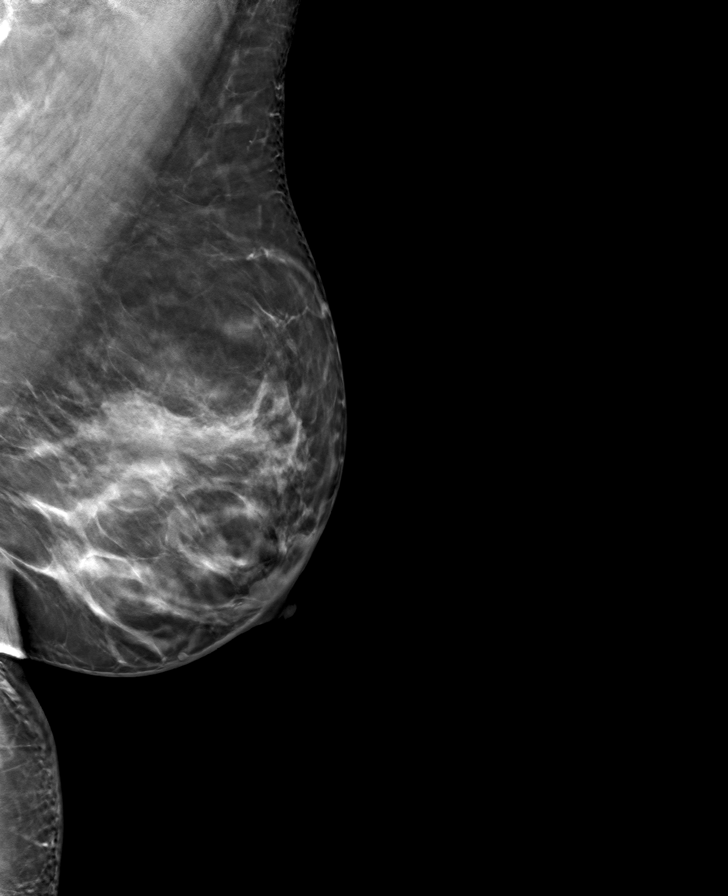

[8 of 24 positions shown; findings below may reference images not displayed]

ACR Breast Density Category c: The breast tissue is heterogeneously
dense, which may obscure small masses.
FINDINGS: There are no findings suspicious for malignancy.
IMPRESSION: No mammographic evidence of malignancy. A result letter of this
screening mammogram will be mailed directly to the patient.

RECOMMENDATION:
Screening mammogram in one year. (Code:Q3-W-BC3)

BI-RADS CATEGORY  1: Negative.

## 2022-08-10 LAB — HM PAP SMEAR: HM Pap smear: NEGATIVE

## 2022-08-10 LAB — HM MAMMOGRAPHY

## 2022-10-06 ENCOUNTER — Other Ambulatory Visit: Payer: Self-pay | Admitting: Family Medicine

## 2022-10-06 DIAGNOSIS — I10 Essential (primary) hypertension: Secondary | ICD-10-CM

## 2022-11-01 ENCOUNTER — Other Ambulatory Visit: Payer: Self-pay | Admitting: Family Medicine

## 2022-11-01 DIAGNOSIS — E876 Hypokalemia: Secondary | ICD-10-CM

## 2023-01-30 ENCOUNTER — Other Ambulatory Visit: Payer: Self-pay | Admitting: Family Medicine

## 2023-01-30 DIAGNOSIS — I10 Essential (primary) hypertension: Secondary | ICD-10-CM

## 2023-02-02 ENCOUNTER — Other Ambulatory Visit: Payer: Self-pay | Admitting: Family Medicine

## 2023-02-02 DIAGNOSIS — R0982 Postnasal drip: Secondary | ICD-10-CM

## 2023-02-02 DIAGNOSIS — E876 Hypokalemia: Secondary | ICD-10-CM

## 2023-02-18 ENCOUNTER — Telehealth (INDEPENDENT_AMBULATORY_CARE_PROVIDER_SITE_OTHER): Payer: BC Managed Care – PPO | Admitting: Family Medicine

## 2023-02-18 ENCOUNTER — Encounter: Payer: Self-pay | Admitting: Family Medicine

## 2023-02-18 DIAGNOSIS — U071 COVID-19: Secondary | ICD-10-CM | POA: Diagnosis not present

## 2023-02-18 DIAGNOSIS — R0981 Nasal congestion: Secondary | ICD-10-CM | POA: Diagnosis not present

## 2023-02-18 DIAGNOSIS — R051 Acute cough: Secondary | ICD-10-CM

## 2023-02-18 MED ORDER — MOLNUPIRAVIR EUA 200MG CAPSULE: 4.0000 | ORAL_CAPSULE | Freq: Two times a day (BID) | ORAL | 0 refills | Status: DC

## 2023-02-18 NOTE — Progress Notes (Signed)
Virtual Visit via Video Note  I connected withNAME@ on 02/18/23 at  4:30 PM EDT by a video enabled telemedicine application and verified that I am speaking with the correct person using two identifiers.  Location patient: home Location provider:work or home office Persons participating in the virtual visit: patient, provider  I discussed the limitations of evaluation and management by telemedicine and the availability of in person appointments. The patient expressed understanding and agreed to proceed.  Chief Complaint  Patient presents with   Covid Positive    Tested positive today, feels like bad head cold, HA stuffy nose, coughing. Muscle aches and fever earlier this wk but have went away. Taking tylenol for fever and aches and mucinex.    HPI: Pt is a 52 year old female followed by Dr. Casimiro Needle and seen for acute concern.  Patient took a home COVID test today that was positive.   Pt started feeling sick on Sunday with aches, HA, stuffy head, subjective fever, chills, cough, n.  Appetite down some. Denies ST, emesis, diarrhea. Symptoms improved some during the wk. Tried Guaifenesin and Tylenol. Pt had 2 COVID vaccines.   ROS: See pertinent positives and negatives per HPI.  Past Medical History:  Diagnosis Date   Anemia    Headache(784.0)    History of migraine    Hypertension     Past Surgical History:  Procedure Laterality Date   APPENDECTOMY     MYOMECHOMY     MYOMECTOMY VAGINAL APPROACH  2008    Family History  Problem Relation Age of Onset   Hypertension Father    Diabetes Father    Kidney disease Father    Healthy Sister    Cancer Maternal Grandmother        colon    Colon cancer Maternal Grandmother    Thyroid disease Mother    Breast cancer Mother 35   Cancer Maternal Grandfather        colon   Pancreatic cancer Maternal Uncle    Esophageal cancer Neg Hx    Rectal cancer Neg Hx    Stomach cancer Neg Hx      Current Outpatient Medications:    Blood  Pressure Monitoring (BLOOD PRESSURE CUFF) MISC, 1 Device by Does not apply route daily., Disp: 1 each, Rfl: 0   fluticasone (FLONASE) 50 MCG/ACT nasal spray, SPRAY 2 SPRAYS INTO EACH NOSTRIL EVERY DAY, Disp: 48 mL, Rfl: 0   lisinopril-hydrochlorothiazide (ZESTORETIC) 20-25 MG tablet, TAKE 1 TABLET BY MOUTH EVERYDAY AT BEDTIME, Disp: 30 tablet, Rfl: 0   meclizine (ANTIVERT) 25 MG tablet, Take 1 tablet (25 mg total) by mouth 3 (three) times daily as needed for dizziness., Disp: 30 tablet, Rfl: 0   Multiple Vitamin (MULTIVITAMIN) tablet, Take 1 tablet by mouth daily., Disp: , Rfl:    norethindrone (MICRONOR) 0.35 MG tablet, Take 1 tablet by mouth daily., Disp: , Rfl:    ondansetron (ZOFRAN-ODT) 4 MG disintegrating tablet, Take 1 tablet (4 mg total) by mouth every 8 (eight) hours as needed for nausea or vomiting., Disp: 15 tablet, Rfl: 0   potassium chloride SA (KLOR-CON M20) 20 MEQ tablet, TAKE 1 TABLET BY MOUTH EVERY DAY, Disp: 30 tablet, Rfl: 0  EXAM:  VITALS per patient if applicable: RR between 12-20 bpm  GENERAL: alert, oriented, appears well and in no acute distress  HEENT: atraumatic, conjunctiva clear, no obvious abnormalities on inspection of external nose and ears  NECK: normal movements of the head and neck  LUNGS: on inspection no signs  of respiratory distress, breathing rate appears normal, no obvious gross SOB, gasping or wheezing  CV: no obvious cyanosis  MS: moves all visible extremities without noticeable abnormality  PSYCH/NEURO: pleasant and cooperative, no obvious depression or anxiety, speech and thought processing grossly intact  ASSESSMENT AND PLAN:  Discussed the following assessment and plan:  COVID-19 virus infection - Plan: molnupiravir EUA (LAGEVRIO) 200 mg CAPS capsule  Acute cough  Nasal congestion  Current symptoms 2/2 acute COVID-19 virus infection.  Discussed r/b/a of antiviral medication as symptoms started less than 5 days ago.  Rx for molnupiravir  sent to pharmacy.  Continue OTC supportive care, rest, hydration, etc.  Given strict precautions.  Follow-up.   I discussed the assessment and treatment plan with the patient. The patient was provided an opportunity to ask questions and all were answered. The patient agreed with the plan and demonstrated an understanding of the instructions.   The patient was advised to call back or seek an in-person evaluation if the symptoms worsen or if the condition fails to improve as anticipated.   Deeann Saint, MD

## 2023-02-19 ENCOUNTER — Telehealth: Payer: Self-pay | Admitting: Family Medicine

## 2023-02-19 ENCOUNTER — Other Ambulatory Visit: Payer: Self-pay | Admitting: Family Medicine

## 2023-02-19 DIAGNOSIS — U071 COVID-19: Secondary | ICD-10-CM

## 2023-02-19 MED ORDER — NIRMATRELVIR/RITONAVIR (PAXLOVID) TABLET (RENAL DOSING): 2.0000 | ORAL_TABLET | Freq: Two times a day (BID) | ORAL | 0 refills | Status: AC

## 2023-02-19 NOTE — Telephone Encounter (Signed)
Saw Banks on 02/18/23 Pt requesting a more cost efficient substitution for the molnupiravir EUA (LAGEVRIO) 200 mg CAPS capsule she was prescribed. Requests a call with an update

## 2023-02-19 NOTE — Telephone Encounter (Signed)
Limited in options.  Paxlovid may be about the same cost-wise.

## 2023-03-02 ENCOUNTER — Other Ambulatory Visit: Payer: Self-pay | Admitting: Family Medicine

## 2023-03-02 DIAGNOSIS — I10 Essential (primary) hypertension: Secondary | ICD-10-CM

## 2023-03-02 DIAGNOSIS — E876 Hypokalemia: Secondary | ICD-10-CM

## 2023-03-18 ENCOUNTER — Encounter (INDEPENDENT_AMBULATORY_CARE_PROVIDER_SITE_OTHER): Payer: Self-pay

## 2023-04-20 ENCOUNTER — Ambulatory Visit (INDEPENDENT_AMBULATORY_CARE_PROVIDER_SITE_OTHER): Payer: BC Managed Care – PPO | Admitting: Family Medicine

## 2023-04-20 ENCOUNTER — Encounter: Payer: Self-pay | Admitting: Family Medicine

## 2023-04-20 VITALS — BP 124/84 | HR 90 | Temp 98.2°F | Ht 65.5 in | Wt 191.4 lb

## 2023-04-20 DIAGNOSIS — N1831 Chronic kidney disease, stage 3a: Secondary | ICD-10-CM

## 2023-04-20 DIAGNOSIS — Z0001 Encounter for general adult medical examination with abnormal findings: Secondary | ICD-10-CM | POA: Diagnosis not present

## 2023-04-20 DIAGNOSIS — I10 Essential (primary) hypertension: Secondary | ICD-10-CM | POA: Diagnosis not present

## 2023-04-20 DIAGNOSIS — Z13220 Encounter for screening for lipoid disorders: Secondary | ICD-10-CM

## 2023-04-20 DIAGNOSIS — N3001 Acute cystitis with hematuria: Secondary | ICD-10-CM | POA: Diagnosis not present

## 2023-04-20 DIAGNOSIS — E876 Hypokalemia: Secondary | ICD-10-CM

## 2023-04-20 DIAGNOSIS — R300 Dysuria: Secondary | ICD-10-CM

## 2023-04-20 LAB — COMPREHENSIVE METABOLIC PANEL
ALT: 14 U/L (ref 0–35)
AST: 20 U/L (ref 0–37)
Albumin: 4.2 g/dL (ref 3.5–5.2)
Alkaline Phosphatase: 64 U/L (ref 39–117)
BUN: 12 mg/dL (ref 6–23)
CO2: 28 meq/L (ref 19–32)
Calcium: 9.6 mg/dL (ref 8.4–10.5)
Chloride: 101 meq/L (ref 96–112)
Creatinine, Ser: 1.42 mg/dL — ABNORMAL HIGH (ref 0.40–1.20)
GFR: 42.08 mL/min — ABNORMAL LOW (ref >60.00)
Glucose, Bld: 109 mg/dL — ABNORMAL HIGH (ref 70–99)
Potassium: 3.3 meq/L — ABNORMAL LOW (ref 3.5–5.1)
Sodium: 137 meq/L (ref 135–145)
Total Bilirubin: 0.6 mg/dL (ref 0.2–1.2)
Total Protein: 7.6 g/dL (ref 6.0–8.3)

## 2023-04-20 LAB — CBC WITH DIFFERENTIAL/PLATELET
Basophils Absolute: 0.1 10*3/uL (ref 0.0–0.1)
Basophils Relative: 0.8 % (ref 0.0–3.0)
Eosinophils Absolute: 0.3 10*3/uL (ref 0.0–0.7)
Eosinophils Relative: 3.4 % (ref 0.0–5.0)
HCT: 36.2 % (ref 36.0–46.0)
Hemoglobin: 11.5 g/dL — ABNORMAL LOW (ref 12.0–15.0)
Lymphocytes Relative: 25.5 % (ref 12.0–46.0)
Lymphs Abs: 2.2 10*3/uL (ref 0.7–4.0)
MCHC: 31.7 g/dL (ref 30.0–36.0)
MCV: 71.1 fl — ABNORMAL LOW (ref 78.0–100.0)
Monocytes Absolute: 0.4 10*3/uL (ref 0.1–1.0)
Monocytes Relative: 4.7 % (ref 3.0–12.0)
Neutro Abs: 5.7 10*3/uL (ref 1.4–7.7)
Neutrophils Relative %: 65.6 % (ref 43.0–77.0)
Platelets: 336 10*3/uL (ref 150.0–400.0)
RBC: 5.09 Mil/uL (ref 3.87–5.11)
RDW: 14.4 % (ref 11.5–15.5)
WBC: 8.8 10*3/uL (ref 4.0–10.5)

## 2023-04-20 LAB — POCT URINALYSIS DIPSTICK
Bilirubin, UA: NEGATIVE
Glucose, UA: NEGATIVE
Ketones, UA: NEGATIVE
Nitrite, UA: NEGATIVE
Protein, UA: POSITIVE — AB
Spec Grav, UA: 1.025 (ref 1.010–1.025)
Urobilinogen, UA: 0.2 U/dL
pH, UA: 6 (ref 5.0–8.0)

## 2023-04-20 LAB — LIPID PANEL
Cholesterol: 148 mg/dL (ref 0–200)
HDL: 38.5 mg/dL — ABNORMAL LOW (ref >39.00)
LDL Cholesterol: 94 mg/dL (ref 0–99)
NonHDL: 109.88
Total CHOL/HDL Ratio: 4
Triglycerides: 77 mg/dL (ref 0.0–149.0)
VLDL: 15.4 mg/dL (ref 0.0–40.0)

## 2023-04-20 MED ORDER — LISINOPRIL-HYDROCHLOROTHIAZIDE 20-25 MG PO TABS: 1.0000 | ORAL_TABLET | Freq: Every day | ORAL | 2 refills | Status: DC

## 2023-04-20 MED ORDER — SULFAMETHOXAZOLE-TRIMETHOPRIM 800-160 MG PO TABS: 1.0000 | ORAL_TABLET | Freq: Two times a day (BID) | ORAL | 0 refills | Status: AC

## 2023-04-20 NOTE — Progress Notes (Signed)
Complete physical exam  Patient: Jean Bond   DOB: August 17, 1970   52 y.o. Female  MRN: [id number]  Subjective:    Chief Complaint  Patient presents with   Annual Exam   Routine physical HPI:  Jean Bond is a 52 y.o. female who presents today for a complete physical exam. She reports consuming a general diet. Gym/ health club routine includes cardio and twice a week. She generally feels well. She reports sleeping fairly well. She does have additional problems to discuss today.    Acute problem HPI: Patient is also having a new symptom of increased frequency and burning with urination. States she started a period on Friday and since then she has been having these symptoms as well. No fever or chills, but feels like when she goes a lot of the time it is just dribbling sometimes has  full bladder. Maybe some mild back pain as well.   Most recent fall risk assessment:    11/05/2021    3:42 PM  Fall Risk   Falls in the past year? 0  Number falls in past yr: 0  Injury with Fall? 0  Risk for fall due to : No Fall Risks  Follow up Falls evaluation completed     Most recent depression screenings:    04/20/2023    9:01 AM 04/02/2022    8:07 AM  PHQ 2/9 Scores  PHQ - 2 Score 0 0  PHQ- 9 Score 0 2    Vision:Within last year and they are watching her eye pressures. 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   meclizine (ANTIVERT) 25 MG tablet Take 1 tablet (25 mg total) by mouth 3 (three) times daily as needed for dizziness.   Multiple Vitamin (MULTIVITAMIN) tablet Take 1 tablet by mouth daily.   norethindrone (MICRONOR) 0.35 MG tablet Take 1 tablet by mouth daily.   ondansetron (ZOFRAN-ODT) 4 MG disintegrating tablet Take 1 tablet (4 mg total) by mouth every 8 (eight) hours as needed for nausea or vomiting.   potassium chloride SA (KLOR-CON M20) 20 MEQ tablet TAKE 1 TABLET BY MOUTH EVERY DAY   [DISCONTINUED] lisinopril-hydrochlorothiazide (ZESTORETIC) 20-25 MG tablet TAKE 1 TABLET BY MOUTH EVERYDAY AT BEDTIME   [DISCONTINUED] fluticasone (FLONASE) 50 MCG/ACT nasal spray SPRAY 2 SPRAYS INTO EACH NOSTRIL EVERY DAY   No facility-administered medications prior to visit.    Review of Systems  Constitutional:  Negative for chills, diaphoresis and fever.  HENT:  Negative for hearing loss.   Eyes:  Negative for blurred vision.  Respiratory:  Negative for shortness of breath.   Cardiovascular:  Negative for chest pain.  Gastrointestinal: Negative.   Genitourinary:  Positive for dysuria, frequency and urgency. Negative for flank pain.  Musculoskeletal:  Negative for back pain.  Neurological:  Negative for headaches.  Psychiatric/Behavioral:  Negative for depression.  Objective:     BP 124/84 (BP Location: Right Arm, Patient Position: Sitting, Cuff Size: Large)   Pulse 90   Temp 98.2 F (36.8 C) (Oral)   Ht 5' 5.5" (1.664 m)   Wt 191 lb 6.4 oz (86.8 kg)   LMP 04/16/2023 (Exact Date)   SpO2 97%   BMI 31.37 kg/m    Physical Exam Vitals reviewed.  Constitutional:      Appearance: Normal appearance. She is well-groomed and normal weight.  HENT:     Right Ear: Tympanic membrane and ear canal normal.     Left Ear: Tympanic membrane and ear canal normal.     Mouth/Throat:     Mouth: Mucous  membranes are moist.     Pharynx: No posterior oropharyngeal erythema.  Eyes:     Conjunctiva/sclera: Conjunctivae normal.  Neck:     Thyroid: No thyromegaly.  Cardiovascular:     Rate and Rhythm: Normal rate and regular rhythm.     Pulses: Normal pulses.     Heart sounds: S1 normal and S2 normal.  Pulmonary:     Effort: Pulmonary effort is normal.     Breath sounds: Normal breath sounds and air entry.  Abdominal:     General: Bowel sounds are normal.     Palpations: Abdomen is soft.     Tenderness: There is no right CVA tenderness or left CVA tenderness.  Musculoskeletal:     Right lower leg: No edema.     Left lower leg: No edema.  Lymphadenopathy:     Cervical: No cervical adenopathy.  Neurological:     Mental Status: She is alert and oriented to person, place, and time. Mental status is at baseline.     Gait: Gait is intact.  Psychiatric:        Mood and Affect: Mood and affect normal.        Speech: Speech normal.        Behavior: Behavior normal.        Judgment: Judgment normal.      Results for orders placed or performed in visit on 04/20/23  POC Urinalysis Dipstick  Result Value Ref Range   Color, UA yellow    Clarity, UA clear    Glucose, UA Negative Negative   Bilirubin, UA negative    Ketones, UA negative    Spec Grav, UA 1.025 1.010 - 1.025   Blood, UA moderate    pH, UA 6.0 5.0 - 8.0   Protein, UA Positive (A) Negative   Urobilinogen, UA 0.2 0.2 or 1.0 E.U./dL   Nitrite, UA negative    Leukocytes, UA Small (1+) (A) Negative   Appearance     Odor         Assessment & Plan:    Routine Health Maintenance and Physical Exam  Immunization History  Administered Date(s) Administered   Influenza,inj,Quad PF,6+ Mos 05/02/2018, 10/06/2019   PFIZER(Purple Top)SARS-COV-2 Vaccination 10/18/2019, 11/10/2019   Tdap 04/12/2020    Health Maintenance  Topic Date Due   Cervical Cancer Screening (HPV/Pap Cotest)  06/02/2020   MAMMOGRAM  07/23/2022    COVID-19 Vaccine (3 - 2023-24 season) 05/06/2023 (Originally 04/04/2023)   Zoster Vaccines- Shingrix (1 of 2) 07/20/2023 (Originally 04/12/2019)   INFLUENZA VACCINE  11/01/2023 (Originally 03/04/2023)   Hepatitis C Screening  04/19/2024 (Originally 04/12/1987)   HIV Screening  04/19/2024 (Originally 04/11/1984)   Colonoscopy  07/16/2029   DTaP/Tdap/Td (2 - Td or Tdap) 04/12/2030   HPV VACCINES  Aged Out  Discussed health benefits of physical activity, and encouraged her to engage in regular exercise appropriate for her age and condition.  Encounter for routine adult health examination with abnormal findings Normal physical exam findings except for what is noted in the HPI and on ROS. Handouts given on healthy eating and exercise. Checking annual labs today for surveillance of kidney function.  -     CBC with Differential/Platelet  Stage 3a chronic kidney disease (HCC) -     Comprehensive metabolic panel  Essential hypertension -     Lisinopril-hydroCHLOROthiazide; Take 1 tablet by mouth daily.  Dispense: 90 tablet; Refill: 2  Lipid screening -     Lipid panel; Future  Dysuria -     POCT urinalysis dipstick -     Urine Culture  Acute cystitis with hematuria NEW symptoms reported today, I completed a pertinent ROS and physical exam related to the new symptom, vitals reviewed. UA shows 1+ LE and moderate blood (pt state she has minimal vaginal bleeding from her period). Will treat with bactrim DS 1 tablet BID and send urine for culture. Further orders TBD depending on the culture results.   -     Sulfamethoxazole-Trimethoprim; Take 1 tablet by mouth 2 (two) times daily for 3 days.  Dispense: 6 tablet; Refill: 0    Return in about 1 year (around 04/19/2024) for annual physical exam.     Karie Georges, MD

## 2023-04-20 NOTE — Patient Instructions (Signed)

## 2023-04-21 ENCOUNTER — Other Ambulatory Visit: Payer: Self-pay | Admitting: Family Medicine

## 2023-04-21 DIAGNOSIS — E876 Hypokalemia: Secondary | ICD-10-CM

## 2023-04-21 MED ORDER — POTASSIUM CHLORIDE CRYS ER 20 MEQ PO TBCR: EXTENDED_RELEASE_TABLET | ORAL | 0 refills | Status: DC

## 2023-04-21 NOTE — Addendum Note (Signed)
Addended by: Karie Georges on: 04/21/2023 08:59 AM   Modules accepted: Orders

## 2023-04-22 LAB — URINE CULTURE
MICRO NUMBER:: 15477482
SPECIMEN QUALITY:: ADEQUATE

## 2023-04-22 MED ORDER — AMPICILLIN 500 MG PO CAPS: 500.0000 mg | ORAL_CAPSULE | Freq: Four times a day (QID) | ORAL | 0 refills | Status: AC

## 2023-04-22 NOTE — Addendum Note (Signed)
Addended by: Karie Georges on: 04/22/2023 09:31 AM   Modules accepted: Orders

## 2023-08-02 ENCOUNTER — Encounter: Payer: Self-pay | Admitting: Hematology and Oncology

## 2023-08-20 LAB — HM MAMMOGRAPHY

## 2023-10-19 ENCOUNTER — Encounter: Payer: Self-pay | Admitting: Family Medicine

## 2023-10-19 ENCOUNTER — Ambulatory Visit: Payer: BC Managed Care – PPO | Admitting: Family Medicine

## 2023-10-19 ENCOUNTER — Encounter: Payer: Self-pay | Admitting: Hematology and Oncology

## 2023-10-19 VITALS — BP 108/78 | HR 95 | Temp 97.8°F | Ht 65.5 in | Wt 190.8 lb

## 2023-10-19 DIAGNOSIS — I10 Essential (primary) hypertension: Secondary | ICD-10-CM

## 2023-10-19 DIAGNOSIS — R739 Hyperglycemia, unspecified: Secondary | ICD-10-CM

## 2023-10-19 LAB — BASIC METABOLIC PANEL
BUN: 10 mg/dL (ref 6–23)
CO2: 28 meq/L (ref 19–32)
Calcium: 9.5 mg/dL (ref 8.4–10.5)
Chloride: 98 meq/L (ref 96–112)
Creatinine, Ser: 1.24 mg/dL — ABNORMAL HIGH (ref 0.40–1.20)
GFR: 49.33 mL/min — ABNORMAL LOW (ref >60.00)
Glucose, Bld: 97 mg/dL (ref 70–99)
Potassium: 3.3 meq/L — ABNORMAL LOW (ref 3.5–5.1)
Sodium: 137 meq/L (ref 135–145)

## 2023-10-19 LAB — HEMOGLOBIN A1C: Hgb A1c MFr Bld: 5.6 % (ref 4.6–6.5)

## 2023-10-19 NOTE — Progress Notes (Signed)
 Established Patient Office Visit  Subjective   Patient ID: Jean Bond, female    DOB: 1971/03/10  Age: 53 y.o. MRN: [id number]  Chief Complaint  Patient presents with   Medical Management of Chronic Issues    Pt is here for follow up on CKD and HTN. She reports no changes in her health since the last visit, no new symptoms or concerns.  HTN -- BP in office performed and is well controlled. She  reports no side effects to the medications, no chest pain, SOB, dizziness or headaches. She has a BP cuff at home and is checking BP regularly, reports they are in the normal range. We reviewed her labs from 6 months ago, the CR was slightly higher than her norm. Pt denies any swelling in her legs or changes in urination. Labs also showed increase in blood sugar, she reports family history of diabetes, will check A1C       Current Outpatient Medications  Medication Instructions   Blood Pressure Monitoring (BLOOD PRESSURE CUFF) MISC 1 Device, Does not apply, Daily   lisinopril-hydrochlorothiazide (ZESTORETIC) 20-25 MG tablet 1 tablet, Oral, Daily   meclizine (ANTIVERT) 25 mg, Oral, 3 times daily PRN   Multiple Vitamin (MULTIVITAMIN) tablet 1 tablet, Daily   norethindrone (MICRONOR) 0.35 MG tablet 1 tablet, Daily   ondansetron (ZOFRAN-ODT) 4 mg, Oral, Every 8 hours PRN   potassium chloride SA (KLOR-CON M) 20 MEQ tablet Take potassium 20 MEQ twice a day for 14 days, then resume 20 MEQ once daily.   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     Objective:     BP 108/78    Pulse 95   Temp 97.8 F (36.6 C) (Oral)   Ht 5' 5.5" (1.664 m)   Wt 190 lb 12.8 oz (86.5 kg)   SpO2 98%   BMI 31.27 kg/m    Physical Exam Vitals reviewed.  Constitutional:      Appearance: Normal appearance. She is well-groomed and normal weight.  Eyes:     Conjunctiva/sclera: Conjunctivae normal.  Cardiovascular:     Rate and Rhythm: Normal rate and regular rhythm.     Pulses: Normal pulses.     Heart sounds: S1 normal and S2 normal.  Pulmonary:     Effort: Pulmonary effort is normal.     Breath sounds: Normal breath sounds and air entry.  Musculoskeletal:     Right lower leg: No edema.     Left lower leg: No edema.  Neurological:     Mental Status: She is alert and oriented to person, place, and time. Mental status is at baseline.     Gait: Gait is intact.  Psychiatric:        Mood and Affect: Mood and affect normal.        Speech: Speech normal.        Behavior: Behavior normal.        Judgment: Judgment normal.      The 10-year ASCVD risk score (Arnett DK, et al., 2019) is: 2.8%    Assessment &  Plan:  Primary hypertension Assessment & Plan: BP well controlled on the medication listed below, will continue this. She is also due for her annual lab work, will order this. Current hypertension medications:       Sig   lisinopril-hydrochlorothiazide (ZESTORETIC) 20-25 MG tablet (Taking) Take 1 tablet by mouth daily.      Chronic, stable, BP is well controlled, will continue the above medications, checking new BMP today  Orders: -     Basic metabolic panel; Future  Hyperglycemia -     Hemoglobin A1c; Future     Return in about 6 months (around 04/20/2024) for annual physical exam.    Karie Georges, MD

## 2023-10-19 NOTE — Assessment & Plan Note (Signed)
 BP well controlled on the medication listed below, will continue this. She is also due for her annual lab work, will order this. Current hypertension medications:       Sig   lisinopril-hydrochlorothiazide (ZESTORETIC) 20-25 MG tablet (Taking) Take 1 tablet by mouth daily.      Chronic, stable, BP is well controlled, will continue the above medications, checking new BMP today

## 2023-11-29 ENCOUNTER — Encounter: Payer: Self-pay | Admitting: Family Medicine

## 2023-11-29 ENCOUNTER — Ambulatory Visit (INDEPENDENT_AMBULATORY_CARE_PROVIDER_SITE_OTHER): Admitting: Family Medicine

## 2023-11-29 ENCOUNTER — Encounter: Payer: Self-pay | Admitting: Hematology and Oncology

## 2023-11-29 VITALS — BP 130/90 | HR 73 | Temp 98.0°F | Resp 16 | Ht 65.5 in | Wt 195.0 lb

## 2023-11-29 DIAGNOSIS — R197 Diarrhea, unspecified: Secondary | ICD-10-CM

## 2023-11-29 DIAGNOSIS — J069 Acute upper respiratory infection, unspecified: Secondary | ICD-10-CM

## 2023-11-29 DIAGNOSIS — R059 Cough, unspecified: Secondary | ICD-10-CM | POA: Diagnosis not present

## 2023-11-29 DIAGNOSIS — I10 Essential (primary) hypertension: Secondary | ICD-10-CM

## 2023-11-29 LAB — POC COVID19 BINAXNOW: SARS Coronavirus 2 Ag: NEGATIVE

## 2023-11-29 MED ORDER — HYDROCODONE BIT-HOMATROP MBR 5-1.5 MG/5ML PO SOLN: 5.0000 mL | Freq: Two times a day (BID) | ORAL | 0 refills | Status: AC | PRN

## 2023-11-29 MED ORDER — FLUTICASONE PROPIONATE 50 MCG/ACT NA SUSP: 1.0000 | Freq: Two times a day (BID) | NASAL | 1 refills | Status: AC

## 2023-11-29 MED ORDER — BENZONATATE 100 MG PO CAPS: 100.0000 mg | ORAL_CAPSULE | Freq: Two times a day (BID) | ORAL | 0 refills | Status: AC | PRN

## 2023-11-29 NOTE — Progress Notes (Addendum)
 ACUTE VISIT Chief Complaint  Patient presents with   Cough    Started on Friday, started feeling stuffy on Thursday. Had diarrhea & cramps Saturday; fever Friday AM. No sick contacts.   HPI: JeanJean Bond is a 53 y.o. female with a PMHx significant for HTN, CKD III, thalassemia-hemoglobin C disease, and iron  deficiency anemia, among some, who is here today complaining of URI symptoms as described above.   Patient complains of productive cough, chest and nasal congestion, rhinorrhea, and ear fullness since 4/24 Also endorses feer (101.0 F) on the morning of 4/25, nausea on 4/26, and diarrhea a few times since 4/26. Her stools are watery and soft.   She has been taking Mucinex  and Ibuprofen.  Tested negative for Covid at home twice.   Cough interferes with sleep.  She mentions that she has had non productive cough for sometime, which she thought it was caused by Lisinopril . It is not very frequent. No heartburn.  No hx of tobacco use. She had a hx of allergies as a child but no hx of asthma.  Pertinent negatives include headache, sore throat, wheezing, SOB, melena, vomiting, or recent travel.  No known sick contact.  Hypertension  on lisinopril -hydrochlorothiazide  20-25 mg daily.  She doesn't check her BP regularly at home. Today in the office it is 130/90.   Review of Systems  Constitutional:  Positive for fatigue. Negative for activity change and appetite change.  HENT:  Positive for postnasal drip. Negative for trouble swallowing.   Gastrointestinal:  Negative for abdominal pain and blood in stool.  Endocrine: Negative for cold intolerance and heat intolerance.  Genitourinary:  Negative for dysuria and hematuria.  Musculoskeletal:  Negative for gait problem and myalgias.  Skin:  Negative for rash.  Allergic/Immunologic: Positive for environmental allergies.  Neurological:  Negative for syncope and weakness.  See other pertinent positives and negatives in  HPI.  Current Outpatient Medications on File Prior to Visit  Medication Sig Dispense Refill   Blood Pressure Monitoring (BLOOD PRESSURE CUFF) MISC 1 Device by Does not apply route daily. 1 each 0   lisinopril -hydrochlorothiazide  (ZESTORETIC ) 20-25 MG tablet Take 1 tablet by mouth daily. 90 tablet 2   meclizine  (ANTIVERT ) 25 MG tablet Take 1 tablet (25 mg total) by mouth 3 (three) times daily as needed for dizziness. 30 tablet 0   Multiple Vitamin (MULTIVITAMIN) tablet Take 1 tablet by mouth daily.     norethindrone (MICRONOR) 0.35 MG tablet Take 1 tablet by mouth daily.     ondansetron  (ZOFRAN -ODT) 4 MG disintegrating tablet Take 1 tablet (4 mg total) by mouth every 8 (eight) hours as needed for nausea or vomiting. 15 tablet 0   potassium chloride  SA (KLOR-CON  M) 20 MEQ tablet Take potassium 20 MEQ twice a day for 14 days, then resume 20 MEQ once daily. 14 tablet 0   potassium chloride  SA (KLOR-CON  M20) 20 MEQ tablet TAKE 1 TABLET BY MOUTH EVERY DAY 90 tablet 3   No current facility-administered medications on file prior to visit.   Past Medical History:  Diagnosis Date   Anemia    Headache(784.0)    History of migraine    Hypertension    Allergies  Allergen Reactions   Amlodipine      fatigue   Social History   Socioeconomic History   Marital status: Married    Spouse name: Not on file   Number of children: Not on file   Years of education: Not on file   Highest  education level: Not on file  Occupational History   Not on file  Tobacco Use   Smoking status: Never   Smokeless tobacco: Never  Vaping Use   Vaping status: Never Used  Substance and Sexual Activity   Alcohol use: No   Drug use: No   Sexual activity: Not on file  Other Topics Concern   Not on file  Social History Narrative   ** Merged History Encounter **       Social Drivers of Health   Financial Resource Strain: Not on file  Food Insecurity: Not on file  Transportation Needs: Not on file  Physical  Activity: Not on file  Stress: Not on file  Social Connections: Not on file   Vitals:   11/29/23 1328  BP: (!) 130/90  Pulse: 73  Resp: 16  Temp: 98 F (36.7 C)  SpO2: 97%   Body mass index is 31.96 kg/m.  Physical Exam Vitals and nursing note reviewed.  Constitutional:      General: She is not in acute distress.    Appearance: She is well-developed. She is not ill-appearing.  HENT:     Head: Normocephalic and atraumatic.     Right Ear: Ear canal and external ear normal. A middle ear effusion is present. Tympanic membrane is not erythematous.     Left Ear: Tympanic membrane, ear canal and external ear normal.  No middle ear effusion.     Nose: Septal deviation and rhinorrhea present.     Right Turbinates: Enlarged.     Left Turbinates: Enlarged.     Mouth/Throat:     Mouth: Mucous membranes are moist.     Pharynx: Oropharynx is clear. Uvula midline.  Eyes:     Conjunctiva/sclera: Conjunctivae normal.  Cardiovascular:     Rate and Rhythm: Normal rate and regular rhythm.     Heart sounds: No murmur heard. Pulmonary:     Effort: Pulmonary effort is normal. No respiratory distress.     Breath sounds: Normal breath sounds. No stridor.  Lymphadenopathy:     Cervical: No cervical adenopathy.  Skin:    General: Skin is warm.     Findings: No erythema or rash.  Neurological:     General: No focal deficit present.     Mental Status: She is alert and oriented to person, place, and time.  Psychiatric:        Mood and Affect: Mood and affect normal.   ASSESSMENT AND PLAN:  [name] was seen today for URI symptoms  Cough, unspecified type We discussed possible etiologies. COVID 19 test negative today. She reports son mild nonproductive cough for a while, which could be related with lisinopril . Lung auscultation negative today, so I do not think imaging is needed. Symptomatic treatment with benzonatate  during the day and Hycodan at night recommended. Allergies  can also be a contributing factor, recommend Flonase  nasal spray at bedtime for 10 to 14 days then as needed.  -     POC COVID-19 BinaxNow -     Benzonatate ; Take 1 capsule (100 mg total) by mouth 2 (two) times daily as needed for up to 10 days.  Dispense: 20 capsule; Refill: 0 -     HYDROcodone  Bit-Homatrop MBr; Take 5 mLs by mouth every 12 (twelve) hours as needed for up to 10 days.  Dispense: 80 mL; Refill: 0  URI, acute Symptoms suggests a viral etiology, recommend symptomatic treatment. Instructed to monitor for signs of complications, including recurrence of fever among  some, clearly instructed about warning signs. Nasal irrigations with saline through the day as needed, Flonase  nasal spray at bedtime,and auto inflation maneuvers to help with nasal congestion and eustachian tube dysfunction recommended.  I also explained that cough and nasal congestion can last a few days and sometimes weeks. F/U as needed.  -     Fluticasone  Propionate; Place 1 spray into both nostrils 2 (two) times daily.  Dispense: 16 g; Refill: 1  Diarrhea, unspecified type Most likely related with above problem, I do  not think further r work up is needed at this time. Monitor for new symptoms. Continue adequate hydration with clear fluids. Colonoscopy 07/2019.  Primary hypertension BP re-checked: 140/90. For now continue lisinopril -HCTZ 20-25 mg daily. We discussed some side effects of the medication, lisinopril  could be causing chronic cough. Instructed to monitor BP at home. Follow-up with PCP.  Return if symptoms worsen or fail to improve.  I, Fritz Jewel Wierda, acting as a scribe for Bernardo Brayman [place], MD., have documented all relevant documentation on the behalf of Vartan Kerins [place], MD, as directed by  Cachet Mccutchen [place], MD while in the presence of Caterina Racine [place], MD.   I, Tenesha Garza [place], MD, have reviewed all documentation for this visit. The documentation on 11/29/23 for the exam, diagnosis, procedures, and orders  are all accurate and complete.  Chimere Klingensmith G. [place], MD  [facility]. [place] office.

## 2023-11-29 NOTE — Patient Instructions (Addendum)
 A few things to remember from today's visit:  Cough, unspecified type - Plan: POC COVID-19, benzonatate (TESSALON) 100 MG capsule, HYDROcodone  bit-homatropine (HYCODAN) 5-1.5 MG/5ML syrup  URI, acute  Diarrhea, unspecified type  Primary hypertension  Monitor blood pressure at home. Pop ears a few times through the day to help with ear congestion. Monitor for new symptoms. Adequate hydration.  Do not use My Chart to request refills or for acute issues that need immediate attention. If you send a my chart message, it may take a few days to be addressed, specially if I am not in the office.  Please be sure medication list is accurate. If a new problem present, please set up appointment sooner than planned today.

## 2023-12-06 ENCOUNTER — Ambulatory Visit: Payer: Self-pay

## 2023-12-06 NOTE — Telephone Encounter (Signed)
 I left pt a detailed message per DPR with the information below. Advised to set up follow up with Dr. Bambi Lever as recommended to discuss blood pressure medication change.

## 2023-12-06 NOTE — Telephone Encounter (Signed)
 Copied from CRM [id number]. Topic: Clinical - Prescription Issue >> Dec 03, 2023  3:34 PM Jean Bond wrote: Reason for CRM: Patient stated that she came in 4/28 and her provider give her a prescription for cough medicine and it's not helping as it should because she's still coughing. Requesting an antibiotic or something to help her congestion.

## 2023-12-06 NOTE — Telephone Encounter (Signed)
 Abx is not recommended for cough management. If she is concerned about pneumonia, we can arrange a CXR. Monitor for fever and SOB. Lisinopril  could also be a contributing factor. Thanks, BJ

## 2023-12-29 ENCOUNTER — Other Ambulatory Visit: Payer: Self-pay | Admitting: Family Medicine

## 2023-12-29 DIAGNOSIS — I10 Essential (primary) hypertension: Secondary | ICD-10-CM

## 2024-04-20 ENCOUNTER — Ambulatory Visit (INDEPENDENT_AMBULATORY_CARE_PROVIDER_SITE_OTHER): Payer: BC Managed Care – PPO | Admitting: Family Medicine

## 2024-04-20 ENCOUNTER — Encounter: Payer: Self-pay | Admitting: Family Medicine

## 2024-04-20 VITALS — BP 102/78 | HR 89 | Temp 98.3°F | Ht 66.5 in | Wt 190.6 lb

## 2024-04-20 DIAGNOSIS — I10 Essential (primary) hypertension: Secondary | ICD-10-CM

## 2024-04-20 DIAGNOSIS — K5909 Other constipation: Secondary | ICD-10-CM | POA: Diagnosis not present

## 2024-04-20 DIAGNOSIS — Z13220 Encounter for screening for lipoid disorders: Secondary | ICD-10-CM | POA: Diagnosis not present

## 2024-04-20 DIAGNOSIS — N1831 Chronic kidney disease, stage 3a: Secondary | ICD-10-CM | POA: Diagnosis not present

## 2024-04-20 DIAGNOSIS — Z131 Encounter for screening for diabetes mellitus: Secondary | ICD-10-CM | POA: Diagnosis not present

## 2024-04-20 DIAGNOSIS — Z0000 Encounter for general adult medical examination without abnormal findings: Secondary | ICD-10-CM

## 2024-04-20 LAB — HEMOGLOBIN A1C: Hgb A1c MFr Bld: 6.1 % (ref 4.6–6.5)

## 2024-04-20 LAB — COMPREHENSIVE METABOLIC PANEL WITH GFR
ALT: 13 U/L (ref 0–35)
AST: 16 U/L (ref 0–37)
Albumin: 4.6 g/dL (ref 3.5–5.2)
Alkaline Phosphatase: 68 U/L (ref 39–117)
BUN: 13 mg/dL (ref 6–23)
CO2: 29 meq/L (ref 19–32)
Calcium: 9.6 mg/dL (ref 8.4–10.5)
Chloride: 101 meq/L (ref 96–112)
Creatinine, Ser: 1.31 mg/dL — ABNORMAL HIGH (ref 0.40–1.20)
GFR: 46.02 mL/min — ABNORMAL LOW (ref >60.00)
Glucose, Bld: 89 mg/dL (ref 70–99)
Potassium: 3.3 meq/L — ABNORMAL LOW (ref 3.5–5.1)
Sodium: 138 meq/L (ref 135–145)
Total Bilirubin: 0.7 mg/dL (ref 0.2–1.2)
Total Protein: 8 g/dL (ref 6.0–8.3)

## 2024-04-20 LAB — LIPID PANEL
Cholesterol: 155 mg/dL (ref 0–200)
HDL: 38.9 mg/dL — ABNORMAL LOW (ref >39.00)
LDL Cholesterol: 100 mg/dL — ABNORMAL HIGH (ref 0–99)
NonHDL: 116.33
Total CHOL/HDL Ratio: 4
Triglycerides: 83 mg/dL (ref 0.0–149.0)
VLDL: 16.6 mg/dL (ref 0.0–40.0)

## 2024-04-20 LAB — TSH: TSH: 2.34 u[IU]/mL (ref 0.35–5.50)

## 2024-04-20 MED ORDER — LOSARTAN POTASSIUM-HCTZ 50-12.5 MG PO TABS: 1.0000 | ORAL_TABLET | Freq: Every day | ORAL | 1 refills | Status: AC

## 2024-04-20 NOTE — Patient Instructions (Addendum)
 Flu vaccine COVID booster Shingles vaccines  Can go to any pharmacy in the area to have them done. Or come here for a nursing visit.  Health Maintenance, Female Adopting a healthy lifestyle and getting preventive care are important in promoting health and wellness. Ask your health care provider about: The right schedule for you to have regular tests and exams. Things you can do on your own to prevent diseases and keep yourself healthy. What should I know about diet, weight, and exercise? Eat a healthy diet  Eat a diet that includes plenty of vegetables, fruits, low-fat dairy products, and lean protein. Do not eat a lot of foods that are high in solid fats, added sugars, or sodium. Maintain a healthy weight Body mass index (BMI) is used to identify weight problems. It estimates body fat based on height and weight. Your health care provider can help determine your BMI and help you achieve or maintain a healthy weight. Get regular exercise Get regular exercise. This is one of the most important things you can do for your health. Most adults should: Exercise for at least 150 minutes each week. The exercise should increase your heart rate and make you sweat (moderate-intensity exercise). Do strengthening exercises at least twice a week. This is in addition to the moderate-intensity exercise. Spend less time sitting. Even light physical activity can be beneficial. Watch cholesterol and blood lipids Have your blood tested for lipids and cholesterol at 55 years of age, then have this test every 5 years. Have your cholesterol levels checked more often if: Your lipid or cholesterol levels are high. You are older than 55 years of age. You are at high risk for heart disease. What should I know about cancer screening? Depending on your health history and family history, you may need to have cancer screening at various ages. This may include screening for: Breast cancer. Cervical cancer. Colorectal  cancer. Skin cancer. Lung cancer. What should I know about heart disease, diabetes, and high blood pressure? Blood pressure and heart disease High blood pressure causes heart disease and increases the risk of stroke. This is more likely to develop in people who have high blood pressure readings or are overweight. Have your blood pressure checked: Every 3-5 years if you are 47-28 years of age. Every year if you are 65 years old or older. Diabetes Have regular diabetes screenings. This checks your fasting blood sugar level. Have the screening done: Once every three years after age 36 if you are at a normal weight and have a low risk for diabetes. More often and at a younger age if you are overweight or have a high risk for diabetes. What should I know about preventing infection? Hepatitis B If you have a higher risk for hepatitis B, you should be screened for this virus. Talk with your health care provider to find out if you are at risk for hepatitis B infection. Hepatitis C Testing is recommended for: Everyone born from 1 through 1967. Anyone with known risk factors for hepatitis C. Sexually transmitted infections (STIs) Get screened for STIs, including gonorrhea and chlamydia, if: You are sexually active and are younger than 55 years of age. You are older than 55 years of age and your health care provider tells you that you are at risk for this type of infection. Your sexual activity has changed since you were last screened, and you are at increased risk for chlamydia or gonorrhea. Ask your health care provider if you are at risk.  Ask your health care provider about whether you are at high risk for HIV. Your health care provider may recommend a prescription medicine to help prevent HIV infection. If you choose to take medicine to prevent HIV, you should first get tested for HIV. You should then be tested every 3 months for as long as you are taking the medicine. Pregnancy If you are  about to stop having your period (premenopausal) and you may become pregnant, seek counseling before you get pregnant. Take 400 to 800 micrograms (mcg) of folic acid every day if you become pregnant. Ask for birth control (contraception) if you want to prevent pregnancy. Osteoporosis and menopause Osteoporosis is a disease in which the bones lose minerals and strength with aging. This can result in bone fractures. If you are 106 years old or older, or if you are at risk for osteoporosis and fractures, ask your health care provider if you should: Be screened for bone loss. Take a calcium or vitamin D supplement to lower your risk of fractures. Be given hormone replacement therapy (HRT) to treat symptoms of menopause. Follow these instructions at home: Alcohol use Do not drink alcohol if: Your health care provider tells you not to drink. You are pregnant, may be pregnant, or are planning to become pregnant. If you drink alcohol: Limit how much you have to: 0-1 drink a day. Know how much alcohol is in your drink. In the [place] one drink equals one 12 oz bottle of beer (355 mL), one 5 oz glass of wine (148 mL), or one 1 oz glass of hard liquor (44 mL). Lifestyle Do not use any products that contain nicotine or tobacco. These products include cigarettes, chewing tobacco, and vaping devices, such as e-cigarettes. If you need help quitting, ask your health care provider. Do not use street drugs. Do not share needles. Ask your health care provider for help if you need support or information about quitting drugs. General instructions Schedule regular health, dental, and eye exams. Stay current with your vaccines. Tell your health care provider if: You often feel depressed. You have ever been abused or do not feel safe at home. Summary Adopting a healthy lifestyle and getting preventive care are important in promoting health and wellness. Follow your health care provider's instructions about  healthy diet, exercising, and getting tested or screened for diseases. Follow your health care provider's instructions on monitoring your cholesterol and blood pressure. This information is not intended to replace advice given to you by your health care provider. Make sure you discuss any questions you have with your health care provider. Document Revised: 12/09/2020 Document Reviewed: 12/09/2020 Elsevier Patient Education  2024 [employer].

## 2024-04-20 NOTE — Progress Notes (Signed)
 Complete physical exam  Patient: Jean Bond   DOB: April 21, 1971   53 y.o. Female  MRN: [id number]  Subjective:    Chief Complaint  Patient presents with   Annual Exam    Jean Bond is a 52 y.o. female who presents today for a complete physical exam. She reports consuming a general diet. Feels like she should eat more veggies and fruit, gets good protein, dairy causes gassiness, takes a MVI daily. Gym/ health club routine includes cardio, light weights, and three days a week. She generally feels well. She reports sleeping fairly well. She does not have additional problems to discuss today.    Most recent fall risk assessment:    11/29/2023    1:33 PM  Fall Risk   Falls in the past year? 0  Number falls in past yr: 0  Injury with Fall? 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   fluticasone  (FLONASE ) 50 MCG/ACT nasal spray Place 1 spray into both nostrils 2 (two) times daily.   Multiple Vitamin (MULTIVITAMIN) tablet Take 1 tablet by mouth daily.   norethindrone (MICRONOR)  0.35 MG tablet Take 1 tablet by mouth daily.   potassium chloride  SA (KLOR-CON  M20) 20 MEQ tablet TAKE 1 TABLET BY MOUTH EVERY DAY   [DISCONTINUED] lisinopril -hydrochlorothiazide  (ZESTORETIC ) 20-25 MG tablet TAKE 1 TABLET BY MOUTH EVERY DAY   [DISCONTINUED] meclizine  (ANTIVERT ) 25 MG tablet Take 1 tablet (25 mg total) by mouth 3 (three) times daily as needed for dizziness.   [DISCONTINUED] ondansetron  (ZOFRAN -ODT) 4 MG disintegrating tablet Take 1 tablet (4 mg total) by mouth every 8 (eight) hours as needed for nausea or vomiting.   [DISCONTINUED] potassium chloride  SA (KLOR-CON  M) 20 MEQ tablet Take potassium 20 MEQ twice a day for 14 days, then resume 20 MEQ once daily.   No facility-administered medications prior to visit.    Review of Systems  HENT:  Negative for hearing loss.   Eyes:  Negative for blurred vision.  Respiratory:  Negative for shortness of breath.   Cardiovascular:  Negative for chest pain.  Gastrointestinal: Negative.   Genitourinary: Negative.   Musculoskeletal:  Negative for back pain.  Neurological:  Negative for headaches.  Psychiatric/Behavioral:  Negative for depression.        Objective:     BP 102/78   Pulse 89   Temp 98.3 F (36.8 C) (Oral)   Ht 5' 6.5 (1.689 m)   Wt 190 lb 9.6 oz (86.5 kg)   SpO2  98%   BMI 30.30 kg/m    Physical Exam Vitals reviewed.  Constitutional:      Appearance: Normal appearance. She is well-groomed. She is obese.  HENT:     Right Ear: Tympanic membrane and ear canal normal.     Left Ear: Tympanic membrane and ear canal normal.     Mouth/Throat:     Mouth: Mucous membranes are moist.     Pharynx: No posterior oropharyngeal erythema.  Eyes:     Conjunctiva/sclera: Conjunctivae normal.  Neck:     Thyroid : No thyromegaly.  Cardiovascular:     Rate and Rhythm: Normal rate and regular rhythm.     Pulses: Normal pulses.     Heart sounds: S1 normal and S2 normal.  Pulmonary:     Effort: Pulmonary effort is normal.      Breath sounds: Normal breath sounds and air entry.  Abdominal:     General: Abdomen is flat. Bowel sounds are normal.     Palpations: Abdomen is soft.  Musculoskeletal:     Right lower leg: No edema.     Left lower leg: No edema.  Lymphadenopathy:     Cervical: No cervical adenopathy.  Neurological:     Mental Status: She is alert and oriented to person, place, and time. Mental status is at baseline.     Gait: Gait is intact.  Psychiatric:        Mood and Affect: Mood and affect normal.        Speech: Speech normal.        Behavior: Behavior normal.        Judgment: Judgment normal.      No results found for any visits on 04/20/24.     Assessment & Plan:    Routine Health Maintenance and Physical Exam  Immunization History  Administered Date(s) Administered   Influenza,inj,Quad PF,6+ Mos 05/02/2018, 10/06/2019   PFIZER(Purple Top)SARS-COV-2 Vaccination 10/18/2019, 11/10/2019   Tdap 04/12/2020    Health Maintenance  Topic Date Due   Pneumococcal Vaccine: 50+ Years (1 of 2 - PCV) Never done   Hepatitis B Vaccines 19-59 Average Risk (1 of 3 - 19+ 3-dose series) Never done   Zoster Vaccines- Shingrix (1 of 2) Never done   COVID-19 Vaccine (3 - 2025-26 season) 04/03/2024   Influenza Vaccine  10/31/2024 (Originally 03/03/2024)   Hepatitis C Screening  04/20/2025 (Originally 04/12/1987)   HIV Screening  04/20/2025 (Originally 04/11/1984)   Mammogram  08/19/2024   Cervical Cancer Screening (HPV/Pap Cotest)  08/10/2025   Colonoscopy  07/16/2029   DTaP/Tdap/Td (2 - Td or Tdap) 04/12/2030   HPV VACCINES  Aged Out   Meningococcal B Vaccine  Aged Out    Discussed health benefits of physical activity, and encouraged her to engage in regular exercise appropriate for her age and condition.  Primary hypertension -     Comprehensive metabolic panel with GFR; Future -     Losartan  Potassium-HCTZ; Take 1 tablet by mouth daily.  Dispense: 90 tablet; Refill: 1  Stage 3a chronic  kidney disease (HCC)  Lipid screening -     Lipid panel; Future  Diabetes mellitus screening -     Hemoglobin A1c; Future  Other constipation -     TSH; Future  Routine general medical examination at a health care facility  General physical exam findings are normal today. I reviewed the patient's preventative testing, immunizations, and lifestyle habits. I made appropriate recommendations and placed orders for the appropriate tests and/or vaccinations. I  counseled the patient on the CDC's recommendations for healthy exercise and diet. I counseled the patient on healthy sleep habits and stress management. Handouts to reinforce the counseling were given at the conclusion of the visit.    Return in 6 months (on 10/18/2024).     Heron CHRISTELLA Sharper, MD

## 2024-04-25 ENCOUNTER — Ambulatory Visit: Payer: Self-pay | Admitting: Family Medicine

## 2024-10-18 ENCOUNTER — Ambulatory Visit: Admitting: Family Medicine
